# Patient Record
Sex: Female | Born: 1940 | Race: White | Hispanic: No | Smoking: Never smoker
Health system: Southern US, Community
[De-identification: ages and names within clinical notes are randomized; demographics above are authoritative.]

## PROBLEM LIST (undated history)

## (undated) DIAGNOSIS — F0280 Dementia in other diseases classified elsewhere without behavioral disturbance: Secondary | ICD-10-CM

## (undated) HISTORY — DX: Dementia in other diseases classified elsewhere, unspecified severity, without behavioral disturbance, psychotic disturbance, mood disturbance, and anxiety: F02.80

---

## 2019-12-07 ENCOUNTER — Other Ambulatory Visit: Payer: Self-pay | Admitting: Acute Care

## 2019-12-07 DIAGNOSIS — R413 Other amnesia: Secondary | ICD-10-CM

## 2019-12-16 ENCOUNTER — Ambulatory Visit
Admission: RE | Admit: 2019-12-16 | Discharge: 2019-12-16 | Disposition: A | Discharge status: Home / Self Care | Payer: Medicare Other | Source: Ambulatory Visit | Attending: Acute Care | Admitting: Acute Care

## 2019-12-16 ENCOUNTER — Other Ambulatory Visit: Payer: Self-pay

## 2019-12-16 DIAGNOSIS — R413 Other amnesia: Secondary | ICD-10-CM | POA: Insufficient documentation

## 2020-08-20 ENCOUNTER — Other Ambulatory Visit: Payer: Self-pay | Admitting: Internal Medicine

## 2020-08-20 DIAGNOSIS — Z1231 Encounter for screening mammogram for malignant neoplasm of breast: Secondary | ICD-10-CM

## 2020-09-20 ENCOUNTER — Ambulatory Visit
Admission: RE | Admit: 2020-09-20 | Discharge: 2020-09-20 | Disposition: A | Discharge status: Home / Self Care | Payer: Medicare Other | Source: Ambulatory Visit | Attending: Internal Medicine | Admitting: Internal Medicine

## 2020-09-20 ENCOUNTER — Other Ambulatory Visit: Payer: Self-pay

## 2020-09-20 DIAGNOSIS — Z1231 Encounter for screening mammogram for malignant neoplasm of breast: Secondary | ICD-10-CM | POA: Insufficient documentation

## 2021-03-15 ENCOUNTER — Ambulatory Visit: Payer: Medicare Other | Attending: Internal Medicine

## 2021-03-15 ENCOUNTER — Other Ambulatory Visit: Payer: Self-pay

## 2021-03-15 DIAGNOSIS — Z23 Encounter for immunization: Secondary | ICD-10-CM

## 2021-03-15 MED ORDER — PFIZER-BIONT COVID-19 VAC-TRIS 30 MCG/0.3ML IM SUSP
INTRAMUSCULAR | 0 refills | Status: DC
  Filled 2021-03-15: qty 0.3, 1d supply, fill #0

## 2021-03-15 NOTE — Progress Notes (Signed)
   Covid-19 Vaccination Clinic  Name:  [name]    MRN: [id number] DOB: January 12, 1941  03/15/2021  [name] was observed post Covid-19 immunization for 15 minutes without incident. She was provided with Vaccine Information Sheet and instruction to access the V-Safe system.   [name] was instructed to call 911 with any severe reactions post vaccine: Marland Kitchen Difficulty breathing  . Swelling of face and throat  . A fast heartbeat  . A bad rash all over body  . Dizziness and weakness   Immunizations Administered    Name Date Dose VIS Date Route   PFIZER Comrnaty(Gray TOP) Covid-19 Vaccine 03/15/2021 10:55 AM 0.3 mL 11/08/2020 Intramuscular   Manufacturer: [employer], [employer]   Lot: [id number]   NDC: [phone]

## 2021-08-15 ENCOUNTER — Other Ambulatory Visit: Payer: Self-pay | Admitting: Internal Medicine

## 2021-08-15 DIAGNOSIS — Z1231 Encounter for screening mammogram for malignant neoplasm of breast: Secondary | ICD-10-CM

## 2021-09-24 ENCOUNTER — Ambulatory Visit
Admission: RE | Admit: 2021-09-24 | Discharge: 2021-09-24 | Disposition: A | Discharge status: Home / Self Care | Payer: Medicare Other | Source: Ambulatory Visit | Attending: Internal Medicine | Admitting: Internal Medicine

## 2021-09-24 ENCOUNTER — Other Ambulatory Visit: Payer: Self-pay

## 2021-09-24 DIAGNOSIS — Z1231 Encounter for screening mammogram for malignant neoplasm of breast: Secondary | ICD-10-CM | POA: Diagnosis present

## 2022-03-10 IMAGING — MG MM DIGITAL SCREENING BILAT W/ TOMO AND CAD
6 of 12 series · 6 of 36 positions shown · non-contrast
Comparison: Previous exam(s).

CLINICAL DATA: Screening.

EXAM:
DIGITAL SCREENING BILATERAL MAMMOGRAM WITH TOMOSYNTHESIS AND CAD
TECHNIQUE: Bilateral screening digital craniocaudal and mediolateral oblique
mammograms were obtained. Bilateral screening digital breast
tomosynthesis was performed. The images were evaluated with
computer-aided detection.

[L MLO synth-2D]
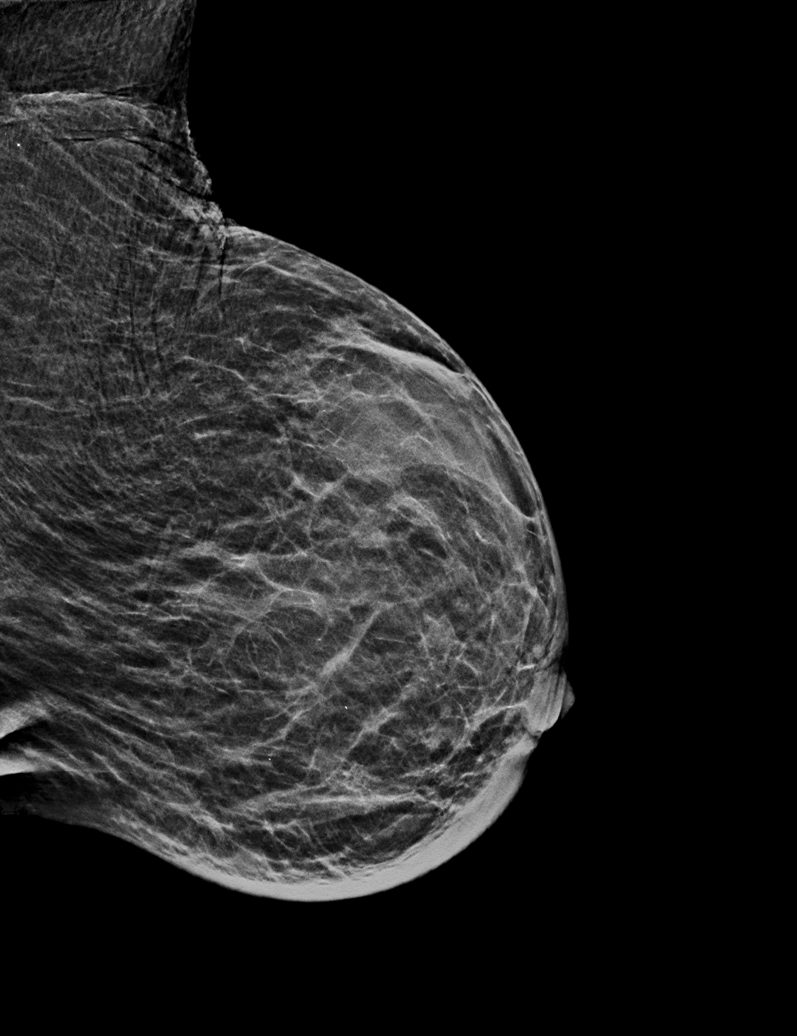

[R MLO synth-2D (1 of 3)]
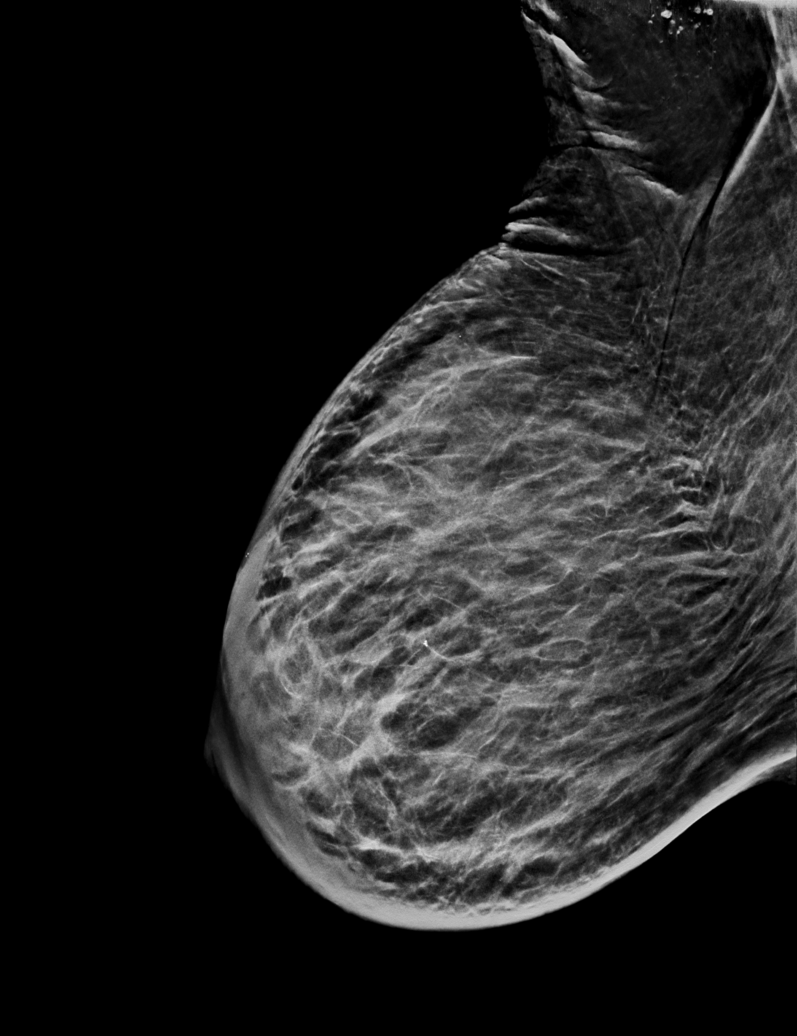

[R MLO synth-2D (2 of 3)]
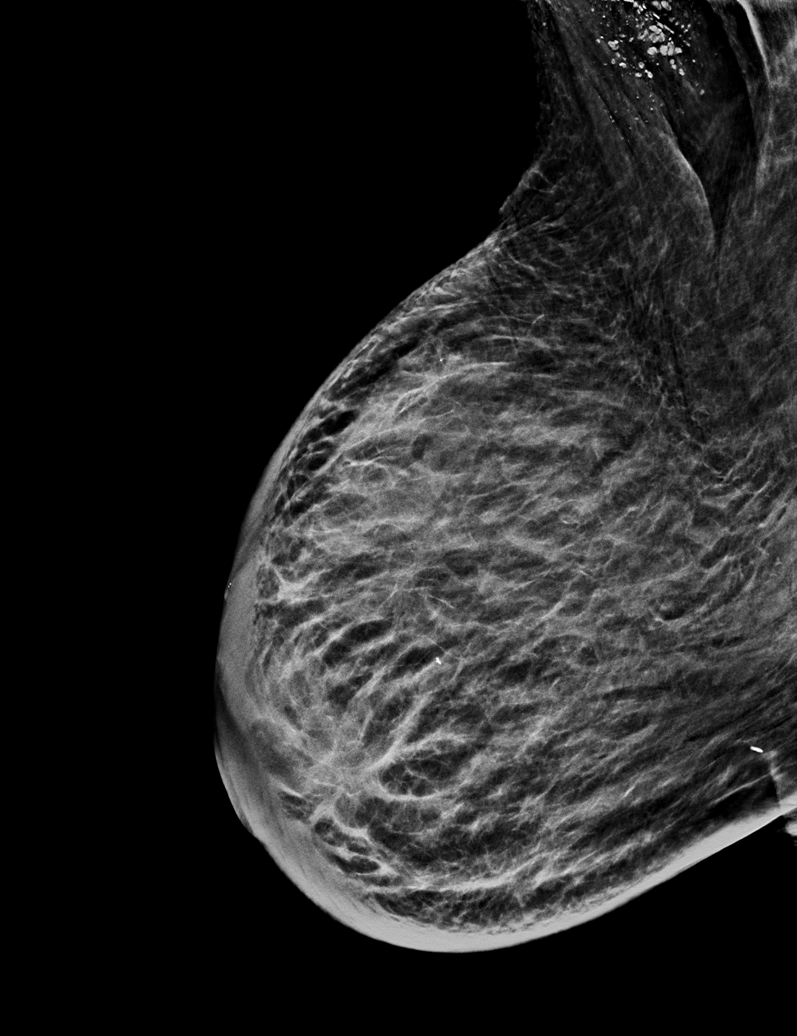

[R MLO synth-2D (3 of 3)]
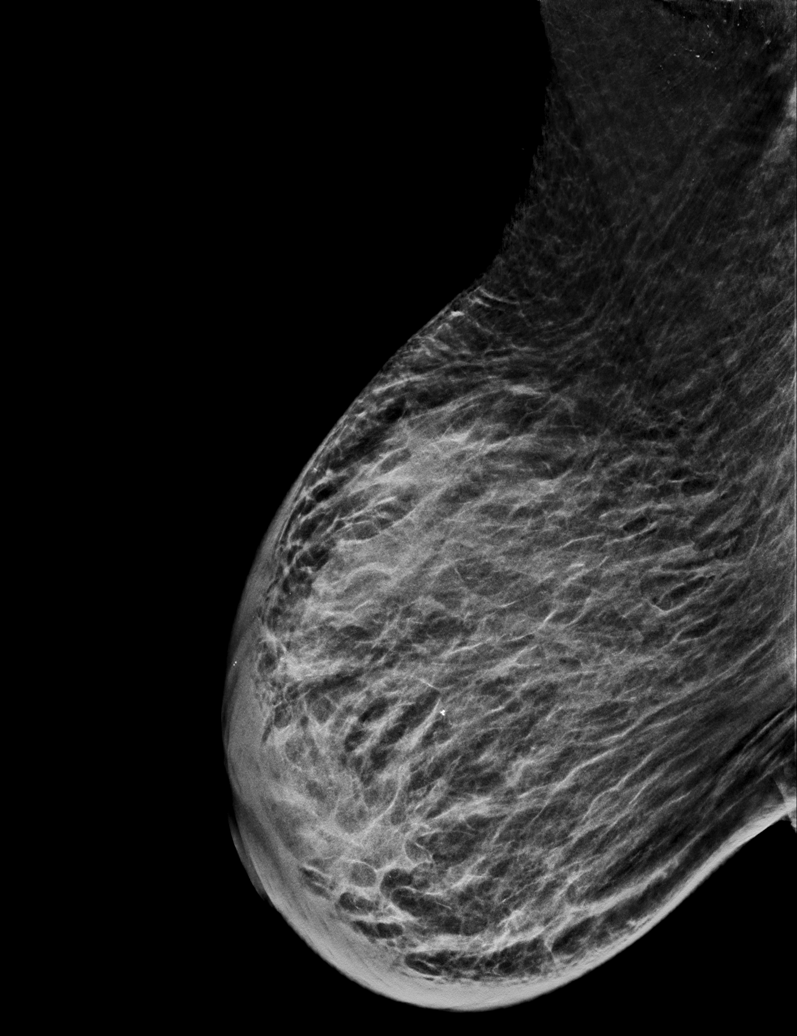

[R CC synth-2D]
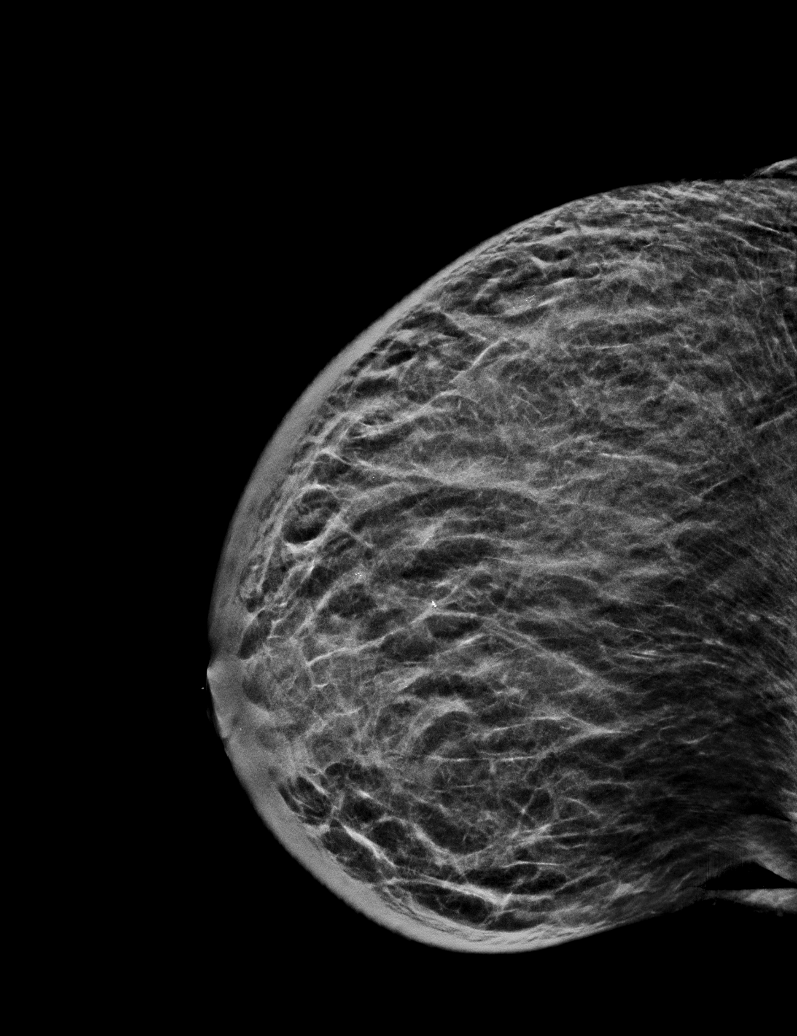

[L CC synth-2D]
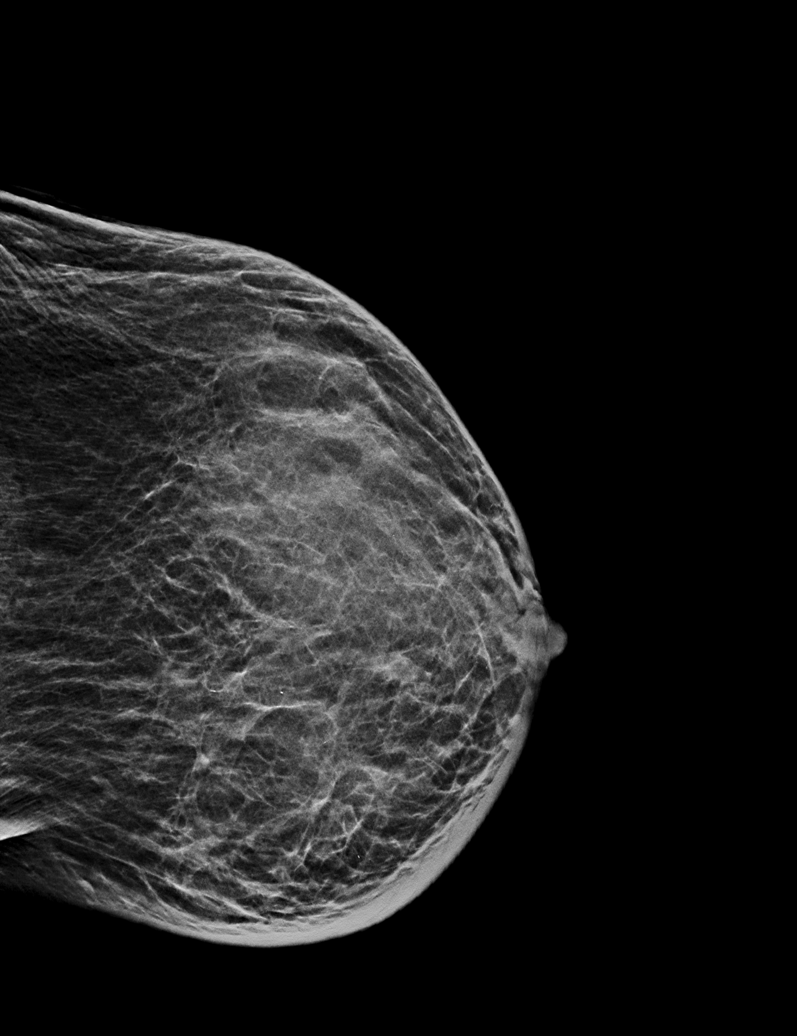

[6 of 36 positions shown; findings below may reference images not displayed]

ACR Breast Density Category c: The breast tissue is heterogeneously
dense, which may obscure small masses.
FINDINGS: There are no findings suspicious for malignancy.

Benign bilateral skin thickening and trabecular thickening
consistent with edema from systemic fluid retention.
IMPRESSION: No mammographic evidence of malignancy. A result letter of this
screening mammogram will be mailed directly to the patient.

RECOMMENDATION:
Screening mammogram in one year. (Code:ZT-[id number])

BI-RADS CATEGORY  2: Benign.  Left o'clock 1 or

## 2022-06-01 ENCOUNTER — Emergency Department: Payer: Medicare Other

## 2022-06-01 ENCOUNTER — Encounter: Payer: Self-pay | Admitting: Radiology

## 2022-06-01 ENCOUNTER — Inpatient Hospital Stay
Admission: EM | Admit: 2022-06-01 | Discharge: 2022-06-04 | DRG: 871 | Disposition: A | Discharge status: Home Health | Payer: Medicare Other | Source: Skilled Nursing Facility | Attending: Internal Medicine | Admitting: Internal Medicine

## 2022-06-01 ENCOUNTER — Other Ambulatory Visit: Payer: Self-pay

## 2022-06-01 DIAGNOSIS — N179 Acute kidney failure, unspecified: Secondary | ICD-10-CM | POA: Diagnosis present

## 2022-06-01 DIAGNOSIS — F02818 Dementia in other diseases classified elsewhere, unspecified severity, with other behavioral disturbance: Secondary | ICD-10-CM | POA: Diagnosis present

## 2022-06-01 DIAGNOSIS — D649 Anemia, unspecified: Secondary | ICD-10-CM | POA: Diagnosis present

## 2022-06-01 DIAGNOSIS — I959 Hypotension, unspecified: Secondary | ICD-10-CM | POA: Diagnosis not present

## 2022-06-01 DIAGNOSIS — M109 Gout, unspecified: Secondary | ICD-10-CM | POA: Diagnosis present

## 2022-06-01 DIAGNOSIS — G9341 Metabolic encephalopathy: Secondary | ICD-10-CM | POA: Diagnosis present

## 2022-06-01 DIAGNOSIS — Z8544 Personal history of malignant neoplasm of other female genital organs: Secondary | ICD-10-CM | POA: Diagnosis not present

## 2022-06-01 DIAGNOSIS — Z20822 Contact with and (suspected) exposure to covid-19: Secondary | ICD-10-CM | POA: Diagnosis present

## 2022-06-01 DIAGNOSIS — Z803 Family history of malignant neoplasm of breast: Secondary | ICD-10-CM | POA: Diagnosis not present

## 2022-06-01 DIAGNOSIS — I10 Essential (primary) hypertension: Secondary | ICD-10-CM | POA: Diagnosis present

## 2022-06-01 DIAGNOSIS — N390 Urinary tract infection, site not specified: Secondary | ICD-10-CM | POA: Diagnosis present

## 2022-06-01 DIAGNOSIS — J189 Pneumonia, unspecified organism: Secondary | ICD-10-CM | POA: Diagnosis not present

## 2022-06-01 DIAGNOSIS — J13 Pneumonia due to Streptococcus pneumoniae: Secondary | ICD-10-CM | POA: Diagnosis present

## 2022-06-01 DIAGNOSIS — F03918 Unspecified dementia, unspecified severity, with other behavioral disturbance: Secondary | ICD-10-CM

## 2022-06-01 DIAGNOSIS — R7303 Prediabetes: Secondary | ICD-10-CM | POA: Diagnosis present

## 2022-06-01 DIAGNOSIS — A403 Sepsis due to Streptococcus pneumoniae: Principal | ICD-10-CM | POA: Diagnosis present

## 2022-06-01 DIAGNOSIS — R6520 Severe sepsis without septic shock: Secondary | ICD-10-CM | POA: Diagnosis present

## 2022-06-01 DIAGNOSIS — A419 Sepsis, unspecified organism: Secondary | ICD-10-CM

## 2022-06-01 DIAGNOSIS — G309 Alzheimer's disease, unspecified: Secondary | ICD-10-CM | POA: Diagnosis present

## 2022-06-01 LAB — RESP PANEL BY RT-PCR (FLU A&B, COVID) ARPGX2
Influenza A by PCR: NEGATIVE
Influenza B by PCR: NEGATIVE
SARS Coronavirus 2 by RT PCR: NEGATIVE

## 2022-06-01 LAB — COMPREHENSIVE METABOLIC PANEL
ALT: 14 U/L (ref 0–44)
AST: 25 U/L (ref 15–41)
Albumin: 2.8 g/dL — ABNORMAL LOW (ref 3.5–5.0)
Alkaline Phosphatase: 56 U/L (ref 38–126)
Anion gap: 8 (ref 5–15)
BUN: 53 mg/dL — ABNORMAL HIGH (ref 8–23)
CO2: 26 mmol/L (ref 22–32)
Calcium: 9.6 mg/dL (ref 8.9–10.3)
Chloride: 109 mmol/L (ref 98–111)
Creatinine, Ser: 1.52 mg/dL — ABNORMAL HIGH (ref 0.44–1.00)
GFR, Estimated: 35 mL/min — ABNORMAL LOW (ref >60)
Glucose, Bld: 124 mg/dL — ABNORMAL HIGH (ref 70–99)
Potassium: 3.9 mmol/L (ref 3.5–5.1)
Sodium: 143 mmol/L (ref 135–145)
Total Bilirubin: 0.7 mg/dL (ref 0.3–1.2)
Total Protein: 5.7 g/dL — ABNORMAL LOW (ref 6.5–8.1)

## 2022-06-01 LAB — LACTIC ACID, PLASMA
Lactic Acid, Venous: 1.7 mmol/L (ref 0.5–1.9)
Lactic Acid, Venous: 1 mmol/L (ref 0.5–1.9)

## 2022-06-01 LAB — URINALYSIS, COMPLETE (UACMP) WITH MICROSCOPIC
Bilirubin Urine: NEGATIVE
Glucose, UA: NEGATIVE mg/dL
Hgb urine dipstick: NEGATIVE
Ketones, ur: NEGATIVE mg/dL
Nitrite: NEGATIVE
Protein, ur: NEGATIVE mg/dL
Specific Gravity, Urine: 1.02 (ref 1.005–1.030)
pH: 5 (ref 5.0–8.0)

## 2022-06-01 LAB — CBC WITH DIFFERENTIAL/PLATELET
Abs Immature Granulocytes: 0.11 10*3/uL — ABNORMAL HIGH (ref 0.00–0.07)
Basophils Absolute: 0.1 10*3/uL (ref 0.0–0.1)
Basophils Relative: 0 %
Eosinophils Absolute: 0.1 10*3/uL (ref 0.0–0.5)
Eosinophils Relative: 1 %
HCT: 33.9 % — ABNORMAL LOW (ref 36.0–46.0)
Hemoglobin: 10.9 g/dL — ABNORMAL LOW (ref 12.0–15.0)
Immature Granulocytes: 1 %
Lymphocytes Relative: 7 %
Lymphs Abs: 1.3 10*3/uL (ref 0.7–4.0)
MCH: 30.4 pg (ref 26.0–34.0)
MCHC: 32.2 g/dL (ref 30.0–36.0)
MCV: 94.4 fL (ref 80.0–100.0)
Monocytes Absolute: 0.7 10*3/uL (ref 0.1–1.0)
Monocytes Relative: 4 %
Neutro Abs: 16 10*3/uL — ABNORMAL HIGH (ref 1.7–7.7)
Neutrophils Relative %: 87 %
Platelets: 176 10*3/uL (ref 150–400)
RBC: 3.59 MIL/uL — ABNORMAL LOW (ref 3.87–5.11)
RDW: 12.9 % (ref 11.5–15.5)
WBC: 18.2 10*3/uL — ABNORMAL HIGH (ref 4.0–10.5)
nRBC: 0 % (ref 0.0–0.2)

## 2022-06-01 LAB — TROPONIN I (HIGH SENSITIVITY)
Troponin I (High Sensitivity): 12 ng/L (ref <18)
Troponin I (High Sensitivity): 9 ng/L (ref <18)

## 2022-06-01 LAB — BRAIN NATRIURETIC PEPTIDE: B Natriuretic Peptide: 165.3 pg/mL — ABNORMAL HIGH (ref 0.0–100.0)

## 2022-06-01 LAB — PROTIME-INR
INR: 1.4 — ABNORMAL HIGH (ref 0.8–1.2)
Prothrombin Time: 16.9 seconds — ABNORMAL HIGH (ref 11.4–15.2)

## 2022-06-01 LAB — APTT: aPTT: 30 seconds (ref 24–36)

## 2022-06-01 MED ORDER — TRAZODONE HCL 50 MG PO TABS
25.0000 mg | ORAL_TABLET | Freq: Every evening | ORAL | Status: DC | PRN
  Administered 2022-06-02: 25 mg via ORAL
  Filled 2022-06-01: qty 1

## 2022-06-01 MED ORDER — ACETAMINOPHEN 650 MG RE SUPP: 650.0000 mg | Freq: Four times a day (QID) | RECTAL | Status: DC | PRN

## 2022-06-01 MED ORDER — SODIUM CHLORIDE 0.9 % IV SOLN
2.0000 g | INTRAVENOUS | Status: DC
  Administered 2022-06-02 – 2022-06-04 (×3): 2 g via INTRAVENOUS
  Filled 2022-06-01 (×3): qty 2

## 2022-06-01 MED ORDER — SODIUM CHLORIDE 0.9 % IV SOLN: 500.0000 mg | INTRAVENOUS | Status: DC

## 2022-06-01 MED ORDER — GUAIFENESIN ER 600 MG PO TB12
600.0000 mg | ORAL_TABLET | Freq: Two times a day (BID) | ORAL | Status: DC
  Administered 2022-06-02 – 2022-06-04 (×5): 600 mg via ORAL
  Filled 2022-06-01 (×5): qty 1

## 2022-06-01 MED ORDER — QUETIAPINE FUMARATE 25 MG PO TABS
25.0000 mg | ORAL_TABLET | Freq: Every day | ORAL | Status: DC
  Administered 2022-06-02 – 2022-06-03 (×2): 25 mg via ORAL
  Filled 2022-06-01 (×2): qty 1

## 2022-06-01 MED ORDER — ALLOPURINOL 100 MG PO TABS
100.0000 mg | ORAL_TABLET | Freq: Every day | ORAL | Status: DC
  Administered 2022-06-02 – 2022-06-04 (×3): 100 mg via ORAL
  Filled 2022-06-01 (×3): qty 1

## 2022-06-01 MED ORDER — ACETAMINOPHEN 325 MG PO TABS: 650.0000 mg | ORAL_TABLET | Freq: Four times a day (QID) | ORAL | Status: DC | PRN

## 2022-06-01 MED ORDER — METRONIDAZOLE 500 MG/100ML IV SOLN
500.0000 mg | Freq: Once | INTRAVENOUS | Status: AC
  Administered 2022-06-01: 500 mg via INTRAVENOUS
  Filled 2022-06-01: qty 100

## 2022-06-01 MED ORDER — VANCOMYCIN HCL 1500 MG/300ML IV SOLN
1500.0000 mg | Freq: Once | INTRAVENOUS | Status: AC
  Administered 2022-06-01: 1500 mg via INTRAVENOUS
  Filled 2022-06-01: qty 300

## 2022-06-01 MED ORDER — ONDANSETRON HCL 4 MG/2ML IJ SOLN: 4.0000 mg | Freq: Four times a day (QID) | INTRAMUSCULAR | Status: DC | PRN

## 2022-06-01 MED ORDER — LORAZEPAM 0.5 MG PO TABS
0.5000 mg | ORAL_TABLET | Freq: Two times a day (BID) | ORAL | Status: DC | PRN
  Administered 2022-06-02: 0.5 mg via ORAL
  Filled 2022-06-01 (×3): qty 1

## 2022-06-01 MED ORDER — MEMANTINE HCL 5 MG PO TABS
5.0000 mg | ORAL_TABLET | Freq: Two times a day (BID) | ORAL | Status: DC
  Administered 2022-06-02 – 2022-06-04 (×5): 5 mg via ORAL
  Filled 2022-06-01 (×5): qty 1

## 2022-06-01 MED ORDER — MAGNESIUM HYDROXIDE 400 MG/5ML PO SUSP: 30.0000 mL | Freq: Every day | ORAL | Status: DC | PRN

## 2022-06-01 MED ORDER — MAGNESIUM HYDROXIDE 400 MG/5ML PO SUSP
30.0000 mL | Freq: Every day | ORAL | Status: DC | PRN
Start: 2022-06-01 — End: 2022-06-04

## 2022-06-01 MED ORDER — ENOXAPARIN SODIUM 40 MG/0.4ML IJ SOSY: 40.0000 mg | PREFILLED_SYRINGE | INTRAMUSCULAR | Status: DC

## 2022-06-01 MED ORDER — SODIUM CHLORIDE 0.9 % IV SOLN: INTRAVENOUS | Status: DC

## 2022-06-01 MED ORDER — ENOXAPARIN SODIUM 40 MG/0.4ML IJ SOSY
40.0000 mg | PREFILLED_SYRINGE | INTRAMUSCULAR | Status: DC
  Administered 2022-06-02 – 2022-06-04 (×3): 40 mg via SUBCUTANEOUS
  Filled 2022-06-01 (×3): qty 0.4

## 2022-06-01 MED ORDER — ONDANSETRON HCL 4 MG PO TABS: 4.0000 mg | ORAL_TABLET | Freq: Four times a day (QID) | ORAL | Status: DC | PRN

## 2022-06-01 MED ORDER — HYDROCOD POLI-CHLORPHE POLI ER 10-8 MG/5ML PO SUER: 5.0000 mL | Freq: Two times a day (BID) | ORAL | Status: DC | PRN

## 2022-06-01 MED ORDER — SODIUM CHLORIDE 0.9 % IV SOLN
500.0000 mg | INTRAVENOUS | Status: DC
  Administered 2022-06-01 – 2022-06-03 (×3): 500 mg via INTRAVENOUS
  Filled 2022-06-01 (×3): qty 5

## 2022-06-01 MED ORDER — VANCOMYCIN HCL IN DEXTROSE 1-5 GM/200ML-% IV SOLN
1000.0000 mg | Freq: Once | INTRAVENOUS | Status: DC
  Filled 2022-06-01: qty 200

## 2022-06-01 MED ORDER — IPRATROPIUM-ALBUTEROL 0.5-2.5 (3) MG/3ML IN SOLN: 3.0000 mL | Freq: Four times a day (QID) | RESPIRATORY_TRACT | Status: DC

## 2022-06-01 MED ORDER — LOSARTAN POTASSIUM 50 MG PO TABS
50.0000 mg | ORAL_TABLET | Freq: Two times a day (BID) | ORAL | Status: DC
  Administered 2022-06-03 – 2022-06-04 (×3): 50 mg via ORAL
  Filled 2022-06-01 (×4): qty 1

## 2022-06-01 MED ORDER — SODIUM CHLORIDE 0.9 % IV BOLUS (SEPSIS)
1000.0000 mL | Freq: Once | INTRAVENOUS | Status: AC
  Administered 2022-06-01: 1000 mL via INTRAVENOUS

## 2022-06-01 MED ORDER — TRAZODONE HCL 50 MG PO TABS: 25.0000 mg | ORAL_TABLET | Freq: Every evening | ORAL | Status: DC | PRN

## 2022-06-01 MED ORDER — SODIUM CHLORIDE 0.9 % IV SOLN: 2.0000 g | INTRAVENOUS | Status: DC

## 2022-06-01 MED ORDER — LACTATED RINGERS IV SOLN: INTRAVENOUS | Status: DC

## 2022-06-01 MED ORDER — ACETAMINOPHEN 325 MG RE SUPP: 650.0000 mg | Freq: Four times a day (QID) | RECTAL | Status: DC | PRN

## 2022-06-01 MED ORDER — IPRATROPIUM-ALBUTEROL 0.5-2.5 (3) MG/3ML IN SOLN
3.0000 mL | Freq: Four times a day (QID) | RESPIRATORY_TRACT | Status: DC
  Administered 2022-06-02: 3 mL via RESPIRATORY_TRACT
  Filled 2022-06-01: qty 3

## 2022-06-01 MED ORDER — SODIUM CHLORIDE 0.9 % IV SOLN
2.0000 g | Freq: Once | INTRAVENOUS | Status: AC
  Administered 2022-06-01: 2 g via INTRAVENOUS
  Filled 2022-06-01: qty 12.5

## 2022-06-01 MED ORDER — ZINC SULFATE 220 (50 ZN) MG PO CAPS
220.0000 mg | ORAL_CAPSULE | Freq: Every day | ORAL | Status: DC
  Administered 2022-06-02 – 2022-06-04 (×3): 220 mg via ORAL
  Filled 2022-06-01 (×3): qty 1

## 2022-06-01 MED ORDER — GUAIFENESIN ER 600 MG PO TB12: 600.0000 mg | ORAL_TABLET | Freq: Two times a day (BID) | ORAL | Status: DC

## 2022-06-01 NOTE — ED Notes (Signed)
Pt sister stating pt had fallen today per report from SNF. Pt sister stating he had fallen twice this week. Sister unsure if snf was concerned for injury.

## 2022-06-01 NOTE — ED Triage Notes (Signed)
Pt from [facility], reportedly more altered than normal. Hx of dementia, but EMS provided no other details on baseline mental status.

## 2022-06-01 NOTE — Consult Note (Signed)
CODE SEPSIS - PHARMACY COMMUNICATION  **Broad Spectrum Antibiotics should be administered within 1 hour of Sepsis diagnosis**  Time Code Sepsis Called/Page Received: 19:51  Antibiotics Ordered: cefepime, metronidazole & Vancomycin  Blood cx collected: 20:07  Time of 1st antibiotic administration: 20:12  Additional action taken by pharmacy: none  If necessary, Name of Provider/Nurse Contacted: NA   Geordie Nooney Rodriguez-Guzman PharmD, BCPS 06/01/2022 7:56 PM

## 2022-06-01 NOTE — Progress Notes (Signed)
Code sepsis tracking by eLINK 

## 2022-06-01 NOTE — Consult Note (Signed)
PHARMACY -  BRIEF ANTIBIOTIC NOTE   Pharmacy has received consult(s) for Cefepime and Vancomycin from an ED provider.  The patient's profile has been reviewed for ht/wt/allergies/indication/available labs.    One time order(s) placed for : Cefepime 2gm Vancomycin 1500mg   Further antibiotics/pharmacy consults should be ordered by admitting physician if indicated.                       Thank you, Apolonia Ellwood Rodriguez-Guzman PharmD, BCPS 06/01/2022 7:59 PM

## 2022-06-01 NOTE — ED Triage Notes (Signed)
Pt arrives today from [facility] for AMS. Pt has PMH of Dementia but staff today is concerned for increased altered mentation. Pt was normotensive this morning but was found hypotensive this evening and EMS was called. EMS found 76/54 NSR. 300 LR was given in L 20 AC IV. Pt arrives alert but disoriented.

## 2022-06-01 NOTE — ED Notes (Signed)
Pt fidgeting in bed. Pt stating "I am going..." pt placed on posey bed exit alarm.

## 2022-06-01 NOTE — H&P (Addendum)
[place]   PATIENT NAME: Monique Johnson    MR#:  [id number]  DATE OF BIRTH:  07-23-1941  DATE OF ADMISSION:  06/01/2022  PRIMARY CARE PHYSICIAN: Lauro Regulus, MD   Patient is coming from: [facility] SNF  REQUESTING/REFERRING PHYSICIAN: Minna Antis, MD  CHIEF COMPLAINT:   Chief Complaint  Patient presents with   Altered Mental Status    HISTORY OF PRESENT ILLNESS:  Monique Johnson is a 81 y.o. female with medical history significant for essential hypertension, Alzheimer's dementia, gout and prediabetes, presented to the emergency room with a [place] of altered mental status with increased somnolence and difficulty to be aroused.  The patient has baseline advanced dementia but has been more sleepy per her daughter.  She was found hypotensive after SNF with a BP of 76/54.  She was started on IV lactated Ringer and brought to the emergency room.  She was very somnolent and unable to give any history in the ER.  No reported dysuria, oliguria or hematuria, urgency or frequency or flank pain.  ED Course: Upon presentation to the emergency room BP was 90/52 with otherwise normal vital signs.  BP later on was 108/61 .   Labs revealed lactic acid 1.7 and later 1 and high-sensitivity troponin I.  CBC showed significant leukocytosis of 18.2 with neutrophilia and anemia.  CMP revealed a BUN of 53 and creatinine 1.52 with albumin of 2.8 and protein 5.7.  BNP was 165.3. Influenza antigens and COVID-19 PCR came back negative. EKG as reviewed by me : EKG showed likely sinus tachycardia with a rate of 152 with suboptimal tracing.  Repeat EKG showed sinus rhythm with a rate of 72 with poor R wave progression. Imaging: Portable chest ray showed the following: 1. Multifocal airspace consolidation throughout both lungs, greatest involving the right upper lobe, suspicious for multifocal pneumonia. Recommend radiographic follow-up to ensure resolution after course of treatment to  exclude the possibility of underlying neoplastic process. 2. Cardiomegaly with tortuous thoracic aorta.  The patient was given IV cefepime and Flagyl as well as vancomycin and 1 L bolus of IV normal saline.  She will be admitted to a medical telemetry bed for further evaluation and management.  PAST MEDICAL HISTORY:  -Essential hypertension - Alzheimer's dementia - Gout - History of vulvar cancer - Prediabetes  PAST SURGICAL HISTORY:  History reviewed. No pertinent surgical history.  No previous reported surgeries.  SOCIAL HISTORY:   Social History   Tobacco Use   Smoking status: Unknown   Smokeless tobacco: Not on file  Substance Use Topics   Alcohol use: Not on file  No reported history of tobacco or HH abuse or illicit drug use.  FAMILY HISTORY:   Family History  Problem Relation Age of Onset   Breast cancer Mother     DRUG ALLERGIES:   Allergies  Allergen Reactions   Levofloxacin Other (See Comments)    RED AND HOT    REVIEW OF SYSTEMS:   ROS As per history of present illness. All pertinent systems were reviewed above. Constitutional, HEENT, cardiovascular, respiratory, GI, GU, musculoskeletal, neuro, psychiatric, endocrine, integumentary and hematologic systems were reviewed and are otherwise negative/unremarkable except for positive findings mentioned above in the HPI.   MEDICATIONS AT HOME:   Prior to Admission medications   Medication Sig Start Date End Date Taking? Authorizing Provider  COVID-19 mRNA Vac-TriS, Pfizer, (PFIZER-BIONT COVID-19 VAC-TRIS) SUSP injection Inject into the muscle. 03/15/21   Judyann Munson, MD  VITAL SIGNS:  Blood pressure (!) 106/51, pulse 74, temperature 97.8 F (36.6 C), resp. rate 20, height 5\' 3"  (1.6 m), weight 64.2 kg, SpO2 100 %.  PHYSICAL EXAMINATION:  Physical Exam  GENERAL:  81 y.o.-year-old fairly somnolent elderly Caucasian female patient lying in the bed with no acute distress.  She is slightly  difficult to arouse. EYES: Pupils equal, round, reactive to light and accommodation. No scleral icterus. Extraocular muscles intact.  HEENT: Head atraumatic, normocephalic. Oropharynx and nasopharynx clear.  NECK:  Supple, no jugular venous distention. No thyroid enlargement, no tenderness.  LUNGS: Diminished bibasilar and midlung zone breath sounds with scattered crackles.  No use of accessory muscles of respiration.  CARDIOVASCULAR: Regular rate and rhythm, S1, S2 normal. No murmurs, rubs, or gallops.  ABDOMEN: Soft, nondistended, nontender. Bowel sounds present. No organomegaly or mass.  EXTREMITIES: No pedal edema, cyanosis, or clubbing.  NEUROLOGIC: Grossly nonfocal.. Sensation intact. Gait not checked.  PSYCHIATRIC: She was very somnolent and difficult to arouse.  SKIN: No obvious rash, lesion, or ulcer.   LABORATORY PANEL:   CBC Recent Labs  Lab 06/01/22 1915  WBC 18.2*  HGB 10.9*  HCT 33.9*  PLT 176   ------------------------------------------------------------------------------------------------------------------  Chemistries  Recent Labs  Lab 06/01/22 1915  NA 143  K 3.9  CL 109  CO2 26  GLUCOSE 124*  BUN 53*  CREATININE 1.52*  CALCIUM 9.6  AST 25  ALT 14  ALKPHOS 56  BILITOT 0.7   ------------------------------------------------------------------------------------------------------------------  Cardiac Enzymes No results for input(s): "TROPONINI" in the last 168 hours. ------------------------------------------------------------------------------------------------------------------  RADIOLOGY:  CT HEAD WO CONTRAST (08/02/22)  Result Date: 06/01/2022 CLINICAL DATA:  Head trauma, minor (Age >= 65y) Dementia patient, more alter than usual. EXAM: CT HEAD WITHOUT CONTRAST TECHNIQUE: Contiguous axial images were obtained from the base of the skull through the vertex without intravenous contrast. RADIATION DOSE REDUCTION: This exam was performed according to the  departmental dose-optimization program which includes automated exposure control, adjustment of the mA and/or kV according to patient size and/or use of iterative reconstruction technique. COMPARISON:  Brain MRI 12/16/2019 FINDINGS: Brain: No intracranial hemorrhage, mass effect, or midline shift. Normal for age brain volume. No hydrocephalus. The basilar cisterns are patent. Mild periventricular chronic small vessel ischemia. No evidence of territorial infarct or acute ischemia. No extra-axial or intracranial fluid collection. Vascular: No hyperdense vessel or unexpected calcification. Skull: No fracture or focal lesion.  Calvarial hyperostosis. Sinuses/Orbits: Paranasal sinuses and mastoid air cells are clear. The visualized orbits are unremarkable. Bilateral cataract resection. Other: None. IMPRESSION: 1. No acute intracranial abnormality. No skull fracture. 2. Mild chronic small vessel ischemia. Electronically Signed   By: 12/18/2019 M.D.   On: 06/01/2022 21:36   DG Chest Port 1 View  Result Date: 06/01/2022 CLINICAL DATA:  Questionable sepsis - evaluate for abnormality Altered mental status. EXAM: PORTABLE CHEST 1 VIEW COMPARISON:  None Available. FINDINGS: Lung volumes are low. Mild cardiomegaly. Tortuous thoracic aorta. Multifocal airspace consolidation, most prominent involving the right upper lobe, lesser within both lung bases. No pulmonary edema. Previous small right pleural effusion. No pneumothorax. IMPRESSION: 1. Multifocal airspace consolidation throughout both lungs, greatest involving the right upper lobe, suspicious for multifocal pneumonia. Recommend radiographic follow-up to ensure resolution after course of treatment to exclude the possibility of underlying neoplastic process. 2. Cardiomegaly with tortuous thoracic aorta. Electronically Signed   By: 08/02/2022 M.D.   On: 06/01/2022 20:33      IMPRESSION AND PLAN:  Assessment and Plan: *  Sepsis due to pneumonia [facility]) - This  is associated with multifocal pneumonia. - Sepsis manifested by significant Oxydose and mild tachypnea. - The patient will be admitted to a medical telemetry bed. - We will continue antibiotic therapy with IV Rocephin and Zithromax. - Mucolytic therapy will be provided. - Bronchodilator therapy will be given. - O2 protocol will be followed.  AKI (acute kidney injury) (HCC) She will better with IV normal saline and will follow BMP. - We will avoid nephrotoxins.  Gout - We will continue allopurinol.  Dementia with behavioral disturbance (HCC) - We will continue Aricept and Seroquel.  Essential hypertension - The patient will be placed on as needed IV hydralazine. - We will hold off Cozaar given acute kidney injury.   DVT prophylaxis: Lovenox.  Advanced Care Planning:  Code Status: full code. Family Communication:  The plan of care was discussed in details with the patient (and family). I answered all questions. The patient agreed to proceed with the above mentioned plan. Further management will depend upon hospital course. Disposition Plan: Back to previous home environment Consults called: None.  All the records are reviewed and case discussed with ED provider.  Status is: Inpatient   At the time of the admission, it appears that the appropriate admission status for this patient is inpatient.  This is judged to be reasonable and necessary in order to provide the required intensity of service to ensure the patient's safety given the presenting symptoms, physical exam findings and initial radiographic and laboratory data in the context of comorbid conditions.  The patient requires inpatient status due to high intensity of service, high risk of further deterioration and high frequency of surveillance required.  I certify that at the time of admission, it is my clinical judgment that the patient will require inpatient hospital care extending more than 2 midnights.                             Dispo: The patient is from: [facility]              Anticipated d/c is to: [facility]              Patient currently is not medically stable to d/c.              Difficult to place patient: No  Hannah Beat M.D on 06/02/2022 at 1:13 AM  [facility]   From 7 PM-7 AM, contact night-coverage [email or web address]  CC: Primary care physician; Lauro Regulus, MD

## 2022-06-01 NOTE — ED Provider Notes (Signed)
[facility] Provider Note    Event Date/Time   First MD Initiated Contact with Patient 06/01/22 1942     (approximate)  History   Chief Complaint: Altered Mental Status  HPI  Monique Johnson is a 81 y.o. female with a past medical history of dementia presents from [facility] for altered mental status.  According to EMS report patient has baseline dementia but today was noted to be increasingly altered and somnolent.  EMS found the patient to be hypotensive 76/54 started the patient on lactated Ringer's and brought the patient to the emergency department.  Here the patient is somnolent but awakens easily to voice.  No acute distress.  Patient is unable to give any history of why she is here or any current complaints.  Physical Exam   Triage Vital Signs: ED Triage Vitals  Enc Vitals Group     BP 06/01/22 1902 (!) 90/52     Pulse Rate 06/01/22 1902 75     Resp 06/01/22 1902 18     Temp 06/01/22 1902 98 F (36.7 C)     Temp Source 06/01/22 1902 Oral     SpO2 06/01/22 1902 94 %     Weight 06/01/22 1903 150 lb (68 kg)     Height 06/01/22 1903 5\' 7"  (1.702 m)     Head Circumference --      Peak Flow --      Pain Score 06/01/22 1902 0     Pain Loc --      Pain Edu? --      Excl. in GC? --     Most recent vital signs: Vitals:   06/01/22 1907 06/01/22 1930  BP:  (!) 97/50  Pulse:  70  Resp:  (!) 22  Temp: 98.4 F (36.9 C)   SpO2:  94%    General: Awake, no distress.  CV:  Good peripheral perfusion.  Regular rate and rhythm  Resp:  Normal effort.  Equal breath sounds bilaterally.  Abd:  No distention.  Soft, nontender.  No rebound or guarding.   ED Results / Procedures / Treatments   EKG  EKG viewed and interpreted by myself shows a normal sinus rhythm at 72 bpm with a narrow QRS, normal axis, normal intervals, no concerning ST changes.  RADIOLOGY  I have viewed and interpreted the CT head images.  No significant bleed seen on my  evaluation. CT read as negative by radiology. Chest x-ray read as most likely multifocal pneumonia   MEDICATIONS ORDERED IN ED: Medications  lactated ringers infusion (has no administration in time range)  sodium chloride 0.9 % bolus 1,000 mL (has no administration in time range)  ceFEPIme (MAXIPIME) 2 g in sodium chloride 0.9 % 100 mL IVPB (2 g Intravenous New Bag/Given 06/01/22 2012)  metroNIDAZOLE (FLAGYL) IVPB 500 mg (has no administration in time range)  vancomycin (VANCOREADY) IVPB 1500 mg/300 mL (has no administration in time range)     IMPRESSION / MDM / ASSESSMENT AND PLAN / ED COURSE  I reviewed the triage vital signs and the nursing notes.  Patient's presentation is most consistent with acute presentation with potential threat to life or bodily function.  Patient presents to the emergency department for increased somnolence and altered mental status compared to her baseline dementia.  Here the patient is not able to provide any additional history.  No concerning findings on physical exam.  Patient does appear to have a slight cough at times, is slightly tachypneic  at 22 breaths/min with a pulse ox of 94%.  Patient noted to be hypotensive 97/50.  Started on IV fluids.  Patient's labs have resulted showing significant leukocytosis of 18,000.  Given the patient's leukocytosis tachypnea and hypotension patient meets sepsis criteria we will start the patient on broad-spectrum antibiotics continue with IV hydration while waiting for the labs chest x-ray and urinalysis.  Patient's chest x-ray shows multifocal pneumonia.  Chemistry shows no significant abnormality.  Patient receiving IV fluids, broad-spectrum antibiotics.  I updated the patient's sister.  We will admit to the hospital service for ongoing work-up and treatment.  CRITICAL CARE Performed by: Minna Antis   Total critical care time: 30 minutes  Critical care time was exclusive of separately billable procedures and  treating other patients.  Critical care was necessary to treat or prevent imminent or life-threatening deterioration.  Critical care was time spent personally by me on the following activities: development of treatment plan with patient and/or surrogate as well as nursing, discussions with consultants, evaluation of patient's response to treatment, examination of patient, obtaining history from patient or surrogate, ordering and performing treatments and interventions, ordering and review of laboratory studies, ordering and review of radiographic studies, pulse oximetry and re-evaluation of patient's condition.   FINAL CLINICAL IMPRESSION(S) / ED DIAGNOSES   Sepsis Altered mental status Multifocal pneumonia  Note:  This document was prepared using Dragon voice recognition software and may include unintentional dictation errors.   Minna Antis, MD 06/01/22 2342

## 2022-06-02 DIAGNOSIS — R6520 Severe sepsis without septic shock: Secondary | ICD-10-CM

## 2022-06-02 DIAGNOSIS — I10 Essential (primary) hypertension: Secondary | ICD-10-CM

## 2022-06-02 DIAGNOSIS — J189 Pneumonia, unspecified organism: Principal | ICD-10-CM

## 2022-06-02 DIAGNOSIS — G9341 Metabolic encephalopathy: Secondary | ICD-10-CM

## 2022-06-02 DIAGNOSIS — N179 Acute kidney failure, unspecified: Secondary | ICD-10-CM | POA: Diagnosis not present

## 2022-06-02 DIAGNOSIS — I959 Hypotension, unspecified: Secondary | ICD-10-CM

## 2022-06-02 DIAGNOSIS — A419 Sepsis, unspecified organism: Secondary | ICD-10-CM | POA: Diagnosis not present

## 2022-06-02 DIAGNOSIS — F03918 Unspecified dementia, unspecified severity, with other behavioral disturbance: Secondary | ICD-10-CM

## 2022-06-02 DIAGNOSIS — M109 Gout, unspecified: Secondary | ICD-10-CM

## 2022-06-02 LAB — CBC
HCT: 31.2 % — ABNORMAL LOW (ref 36.0–46.0)
Hemoglobin: 10 g/dL — ABNORMAL LOW (ref 12.0–15.0)
MCH: 30 pg (ref 26.0–34.0)
MCHC: 32.1 g/dL (ref 30.0–36.0)
MCV: 93.7 fL (ref 80.0–100.0)
Platelets: 170 10*3/uL (ref 150–400)
RBC: 3.33 MIL/uL — ABNORMAL LOW (ref 3.87–5.11)
RDW: 13 % (ref 11.5–15.5)
WBC: 13 10*3/uL — ABNORMAL HIGH (ref 4.0–10.5)
nRBC: 0 % (ref 0.0–0.2)

## 2022-06-02 LAB — BASIC METABOLIC PANEL
Anion gap: 4 — ABNORMAL LOW (ref 5–15)
BUN: 48 mg/dL — ABNORMAL HIGH (ref 8–23)
CO2: 24 mmol/L (ref 22–32)
Calcium: 9.2 mg/dL (ref 8.9–10.3)
Chloride: 116 mmol/L — ABNORMAL HIGH (ref 98–111)
Creatinine, Ser: 1.15 mg/dL — ABNORMAL HIGH (ref 0.44–1.00)
GFR, Estimated: 49 mL/min — ABNORMAL LOW (ref >60)
Glucose, Bld: 99 mg/dL (ref 70–99)
Potassium: 3.7 mmol/L (ref 3.5–5.1)
Sodium: 144 mmol/L (ref 135–145)

## 2022-06-02 LAB — CORTISOL-AM, BLOOD: Cortisol - AM: 19.2 ug/dL (ref 6.7–22.6)

## 2022-06-02 LAB — PROTIME-INR
INR: 1.3 — ABNORMAL HIGH (ref 0.8–1.2)
Prothrombin Time: 16.4 seconds — ABNORMAL HIGH (ref 11.4–15.2)

## 2022-06-02 LAB — PROCALCITONIN: Procalcitonin: 16.77 ng/mL

## 2022-06-02 MED ORDER — IPRATROPIUM-ALBUTEROL 0.5-2.5 (3) MG/3ML IN SOLN
3.0000 mL | Freq: Three times a day (TID) | RESPIRATORY_TRACT | Status: DC
  Administered 2022-06-02 – 2022-06-03 (×3): 3 mL via RESPIRATORY_TRACT
  Filled 2022-06-02 (×3): qty 3

## 2022-06-02 NOTE — Progress Notes (Incomplete)
       CROSS COVER NOTE  NAME: Monique Johnson MRN: [id number] DOB : 1941-03-21    Date of Service     HPI/Events of Note     Interventions   Plan: Hold PM Cozaar X X      This document was prepared using Dragon voice recognition software and may include unintentional dictation errors.  Bishop Limbo DNP, MHA, FNP-BC Nurse Practitioner [facility] Pager [phone]

## 2022-06-02 NOTE — Assessment & Plan Note (Signed)
She will better with IV normal saline and will follow BMP. - We will avoid nephrotoxins.

## 2022-06-02 NOTE — Assessment & Plan Note (Signed)
-   We will continue Aricept and Seroquel. 

## 2022-06-02 NOTE — Assessment & Plan Note (Addendum)
-   This is associated with multifocal pneumonia. - Sepsis manifested by significant Oxydose and mild tachypnea. - The patient will be admitted to a medical telemetry bed. - We will continue antibiotic therapy with IV Rocephin and Zithromax. - Mucolytic therapy will be provided. - Bronchodilator therapy will be given. - O2 protocol will be followed.

## 2022-06-02 NOTE — Assessment & Plan Note (Signed)
-   We will continue allopurinol 

## 2022-06-02 NOTE — Progress Notes (Signed)
  Progress Note   Patient: Monique Johnson HGD:[id number] DOB: [birth year] DOA: 06/01/2022     1 DOS: the patient was seen and examined on 06/02/2022   Brief hospital course: Kensley Valladares is a 81 y.o. female with medical history significant for essential hypertension, Alzheimer's dementia, gout and prediabetes, presented to the emergency room with chief complaint of altered mental status with increased somnolence and difficulty to be aroused.  The patient has baseline advanced dementia but has been more sleepy per her daughter.  She was found hypotensive after SNF with a BP of 76/54.  She was started on IV lactated Ringer and brought to the emergency room. Upon arriving the hospital, she was hypotensive, received IV fluid bolus.  She also had a tachycardia and leukocytosis and acute renal failure.  Consistent with severe sepsis. Chest x-ray showed bilateral multifocal pneumonia, she is started on Rocephin and Zithromax.  Assessment and Plan: Severe sepsis secondary to pneumonia. Multifocal pneumonia. Acute metabolic encephalopathy secondary to pneumonia. Hypotension secondary to severe sepsis. Acute kidney injury secondary to severe sepsis. Currently, patient is on room air. Patient has clinical evidence of severe sepsis and pneumonia. Her mental status seems to be improving today, continue antibiotics with Rocephin and Zithromax.  Follow-up on blood culture results. Blood pressure is more stable, will discontinue IV fluids.  Renal function also getting better.  Dementia with behavioral disturbance. Continue Aricept and Seroquel.  Essential hypertension. Hold blood pressure medicine for hypotension.       Subjective:  Patient seem to have confusion which appear to be chronic. No agitation. No short of breath today.  Physical Exam: Vitals:   06/01/22 2252 06/02/22 0514 06/02/22 0714 06/02/22 0729  BP: (!) 106/51 (!) 98/51 (!) 101/55   Pulse: 74 63 63   Resp: 20 20 16    Temp: 97.8  F (36.6 C) 98.4 F (36.9 C) 97.7 F (36.5 C)   TempSrc:      SpO2: 100% 94% 90% 91%  Weight: 64.2 kg     Height: 5\' 3"  (1.6 m)      General exam: Appears calm and comfortable  Respiratory system: Clear to auscultation. Respiratory effort normal. Cardiovascular system: S1 & S2 heard, RRR. No JVD, murmurs, rubs, gallops or clicks. No pedal edema. Gastrointestinal system: Abdomen is nondistended, soft and nontender. No organomegaly or masses felt. Normal bowel sounds heard. Central nervous system: Alert and oriented x1. No focal neurological deficits. Extremities: Symmetric 5 x 5 power. Skin: No rashes, lesions or ulcers   Data Reviewed:  Reviewed chest x-ray report and image, reviewed all lab results.  Family Communication: Could not reach sister over the phone  Disposition: Status is: Inpatient Remains inpatient appropriate because: Severity of disease, IV antibiotic treatment.  Planned Discharge Destination: Skilled nursing facility    Time spent: 50 minutes  Author: , MD 06/02/2022 11:13 AM  For on call review [email or web address].

## 2022-06-02 NOTE — Assessment & Plan Note (Signed)
-   The patient will be placed on as needed IV hydralazine. - We will hold off Cozaar given acute kidney injury.

## 2022-06-02 NOTE — Hospital Course (Signed)
Monique Johnson is a 81 y.o. female with medical history significant for essential hypertension, Alzheimer's dementia, gout and prediabetes, presented to the emergency room with chief complaint of altered mental status with increased somnolence and difficulty to be aroused.  The patient has baseline advanced dementia but has been more sleepy per her daughter.  She was found hypotensive after SNF with a BP of 76/54.  She was started on IV lactated Ringer and brought to the emergency room. Upon arriving the hospital, she was hypotensive, received IV fluid bolus.  She also had a tachycardia and leukocytosis and acute renal failure.  Consistent with severe sepsis. Chest x-ray showed bilateral multifocal pneumonia, she is started on Rocephin and Zithromax.

## 2022-06-03 DIAGNOSIS — G9341 Metabolic encephalopathy: Secondary | ICD-10-CM | POA: Diagnosis not present

## 2022-06-03 DIAGNOSIS — A419 Sepsis, unspecified organism: Secondary | ICD-10-CM | POA: Diagnosis not present

## 2022-06-03 DIAGNOSIS — J189 Pneumonia, unspecified organism: Secondary | ICD-10-CM | POA: Diagnosis not present

## 2022-06-03 DIAGNOSIS — N179 Acute kidney failure, unspecified: Secondary | ICD-10-CM | POA: Diagnosis not present

## 2022-06-03 LAB — URINE CULTURE: Culture: >=100000 — AB

## 2022-06-03 LAB — BASIC METABOLIC PANEL
Anion gap: 4 — ABNORMAL LOW (ref 5–15)
BUN: 35 mg/dL — ABNORMAL HIGH (ref 8–23)
CO2: 22 mmol/L (ref 22–32)
Calcium: 8.6 mg/dL — ABNORMAL LOW (ref 8.9–10.3)
Chloride: 116 mmol/L — ABNORMAL HIGH (ref 98–111)
Creatinine, Ser: 0.99 mg/dL (ref 0.44–1.00)
GFR, Estimated: 58 mL/min — ABNORMAL LOW (ref >60)
Glucose, Bld: 113 mg/dL — ABNORMAL HIGH (ref 70–99)
Potassium: 3.6 mmol/L (ref 3.5–5.1)
Sodium: 142 mmol/L (ref 135–145)

## 2022-06-03 LAB — MAGNESIUM: Magnesium: 1.8 mg/dL (ref 1.7–2.4)

## 2022-06-03 MED ORDER — IPRATROPIUM-ALBUTEROL 0.5-2.5 (3) MG/3ML IN SOLN: 3.0000 mL | RESPIRATORY_TRACT | Status: DC | PRN

## 2022-06-03 NOTE — NC FL2 (Signed)
[place] MEDICAID FL2 LEVEL OF CARE SCREENING TOOL     IDENTIFICATION  Patient Name: Monique Johnson Birthdate: 1941-03-26 Sex: female Admission Date (Current Location): 06/01/2022  [facility] and [place] Number:  Chiropodist and Address:  [facility], [address] [place], [place] [address]      Provider Number: [id number]  Attending Physician Name and Address:  Marrion Coy, MD  Relative Name and Phone Number:       Current Level of Care: Hospital Recommended Level of Care: Assisted Living Facility Prior Approval Number:    Date Approved/Denied:   PASRR Number:    Discharge Plan: Other (Comment) (ALF)    Current Diagnoses: Patient Active Problem List   Diagnosis Date Noted   Essential hypertension 06/02/2022   AKI (acute kidney injury) (HCC) 06/02/2022   Dementia with behavioral disturbance (HCC) 06/02/2022   Gout 06/02/2022   Multifocal pneumonia 06/02/2022   Acute metabolic encephalopathy 06/02/2022   Hypotension 06/02/2022   Severe sepsis (HCC) 06/01/2022    Orientation RESPIRATION BLADDER Height & Weight     Self  Normal Incontinent Weight: 141 lb 8.6 oz (64.2 kg) Height:  5\' 3"  (160 cm)  BEHAVIORAL SYMPTOMS/MOOD NEUROLOGICAL BOWEL NUTRITION STATUS      Continent Diet (heart healthy, thin liquids)  AMBULATORY STATUS COMMUNICATION OF NEEDS Skin   Limited Assist Verbally Normal                       Personal Care Assistance Level of Assistance  Bathing, Feeding, Dressing Bathing Assistance: Limited assistance Feeding assistance: Independent Dressing Assistance: Limited assistance     Functional Limitations Info  Sight, Hearing, Speech Sight Info: Adequate Hearing Info: Adequate Speech Info: Adequate    SPECIAL CARE FACTORS FREQUENCY  PT (By licensed PT), OT (By licensed OT)     PT Frequency: 2x OT Frequency: 2x            Contractures Contractures Info: Not present    Additional Factors Info   Code Status, Allergies Code Status Info: full code Allergies Info: Levofloxacin           Current Medications (06/03/2022):  This is the current hospital active medication list Current Facility-Administered Medications  Medication Dose Route Frequency Provider Last Rate Last Admin   acetaminophen (TYLENOL) tablet 650 mg  650 mg Oral Q6H PRN Mansy, Jan A, MD       Or   acetaminophen (TYLENOL) suppository 650 mg  650 mg Rectal Q6H PRN Mansy, Jan A, MD       allopurinol (ZYLOPRIM) tablet 100 mg  100 mg Oral Daily Mansy, Jan A, MD   100 mg at 06/03/22 1109   azithromycin (ZITHROMAX) 500 mg in sodium chloride 0.9 % 250 mL IVPB  500 mg Intravenous Q24H Mansy, Jan A, MD 250 mL/hr at 06/02/22 2102 500 mg at 06/02/22 2102   cefTRIAXone (ROCEPHIN) 2 g in sodium chloride 0.9 % 100 mL IVPB  2 g Intravenous Q24H Mansy, Jan A, MD 200 mL/hr at 06/03/22 0934 2 g at 06/03/22 0934   chlorpheniramine-HYDROcodone 10-8 MG/5ML suspension 5 mL  5 mL Oral Q12H PRN Mansy, Jan A, MD       enoxaparin (LOVENOX) injection 40 mg  40 mg Subcutaneous Q24H Mansy, Jan A, MD   40 mg at 06/03/22 1109   guaiFENesin (MUCINEX) 12 hr tablet 600 mg  600 mg Oral BID Mansy, Jan A, MD   600 mg at 06/03/22 1109  ipratropium-albuterol (DUONEB) 0.5-2.5 (3) MG/3ML nebulizer solution 3 mL  3 mL Nebulization Q4H PRN Marrion Coy, MD       LORazepam (ATIVAN) tablet 0.5 mg  0.5 mg Oral BID PRN Mansy, Jan A, MD   0.5 mg at 06/02/22 1535   losartan (COZAAR) tablet 50 mg  50 mg Oral BID Mansy, Jan A, MD   50 mg at 06/03/22 1109   magnesium hydroxide (MILK OF MAGNESIA) suspension 30 mL  30 mL Oral Daily PRN Mansy, Jan A, MD       memantine [facility]) tablet 5 mg  5 mg Oral BID Mansy, Jan A, MD   5 mg at 06/03/22 1109   ondansetron (ZOFRAN) tablet 4 mg  4 mg Oral Q6H PRN Mansy, Jan A, MD       Or   ondansetron [facility]) injection 4 mg  4 mg Intravenous Q6H PRN Mansy, Jan A, MD       QUEtiapine (SEROQUEL) tablet 25 mg  25 mg Oral QHS Mansy, Jan A,  MD   25 mg at 06/02/22 2004   traZODone (DESYREL) tablet 25 mg  25 mg Oral QHS PRN Mansy, Jan A, MD   25 mg at 06/02/22 2004   zinc sulfate capsule 220 mg  220 mg Oral Daily Mansy, Jan A, MD   220 mg at 06/03/22 1109     Discharge Medications: Please see discharge summary for a list of discharge medications.  Relevant Imaging Results:  Relevant Lab Results:   Additional Information SSN:[id number]  Maree Krabbe, LCSW

## 2022-06-03 NOTE — Progress Notes (Signed)
  Progress Note   Patient: Monique Johnson AOZ:[id number] DOB: 04/11/1941 DOA: 06/01/2022     2 DOS: the patient was seen and examined on 06/03/2022   Brief hospital course: Monique Johnson is a 81 y.o. female with medical history significant for essential hypertension, Alzheimer's dementia, gout and prediabetes, presented to the emergency room with chief complaint of altered mental status with increased somnolence and difficulty to be aroused.  The patient has baseline advanced dementia but has been more sleepy per her daughter.  She was found hypotensive after SNF with a BP of 76/54.  She was started on IV lactated Ringer and brought to the emergency room. Upon arriving the hospital, she was hypotensive, received IV fluid bolus.  She also had a tachycardia and leukocytosis and acute renal failure.  Consistent with severe sepsis. Chest x-ray showed bilateral multifocal pneumonia, she is started on Rocephin and Zithromax.  Assessment and Plan: Severe sepsis secondary to pneumonia. Multifocal pneumonia. Acute metabolic encephalopathy secondary to pneumonia. Hypotension secondary to severe sepsis. Acute kidney injury secondary to severe sepsis. Patient has clinical evidence of severe sepsis and pneumonia. Her mental status has improved, renal function also improved.  Blood culture came back no growth. Continue antibiotics with Rocephin and Zithromax. Patient came from assisted living facility, will obtain PT/OT.  May be able to return tomorrow if okay with PT.  Lactobacillus UTI. Continue above antibiotics.   Dementia with behavioral disturbance. Continue Aricept and Seroquel.  Essential hypertension. Continue hold blood pressure medicines.        Subjective:  Patient has baseline confusion, no agitation. No shortness of breath or cough.  Physical Exam: Vitals:   06/02/22 2001 06/03/22 0553 06/03/22 0708 06/03/22 0810  BP: (!) 110/49 (!) 131/56  (!) 127/55  Pulse: (!) 102 68  73   Resp:  18  19  Temp: 100 F (37.8 C) 97.7 F (36.5 C)  98.7 F (37.1 C)  TempSrc:      SpO2: 93% 90% 92% 94%  Weight:      Height:       General exam: Appears calm and comfortable  Respiratory system: Clear to auscultation. Respiratory effort normal. Cardiovascular system: S1 & S2 heard, RRR. No JVD, murmurs, rubs, gallops or clicks. No pedal edema. Gastrointestinal system: Abdomen is nondistended, soft and nontender. No organomegaly or masses felt. Normal bowel sounds heard. Central nervous system: Alert and oriented x1. No focal neurological deficits. Extremities: Symmetric 5 x 5 power. Skin: No rashes, lesions or ulcers   Data Reviewed:  Blood  culture negative, urine culture has lactobacillus.  Family Communication: Sister updated at bedside.  Disposition: Status is: Inpatient Remains inpatient appropriate because: Severity of disease, IV antibiotics.  Planned Discharge Destination:  ALF vs SNF    Time spent: 35 minutes  Author: Marrion Coy, MD 06/03/2022 1:52 PM  For on call review [email or web address].

## 2022-06-04 DIAGNOSIS — A419 Sepsis, unspecified organism: Secondary | ICD-10-CM | POA: Diagnosis not present

## 2022-06-04 DIAGNOSIS — J189 Pneumonia, unspecified organism: Secondary | ICD-10-CM | POA: Diagnosis not present

## 2022-06-04 DIAGNOSIS — G9341 Metabolic encephalopathy: Secondary | ICD-10-CM | POA: Diagnosis not present

## 2022-06-04 DIAGNOSIS — I959 Hypotension, unspecified: Secondary | ICD-10-CM

## 2022-06-04 DIAGNOSIS — N179 Acute kidney failure, unspecified: Secondary | ICD-10-CM | POA: Diagnosis not present

## 2022-06-04 LAB — BASIC METABOLIC PANEL
Anion gap: 4 — ABNORMAL LOW (ref 5–15)
BUN: 23 mg/dL (ref 8–23)
CO2: 25 mmol/L (ref 22–32)
Calcium: 9.3 mg/dL (ref 8.9–10.3)
Chloride: 114 mmol/L — ABNORMAL HIGH (ref 98–111)
Creatinine, Ser: 0.84 mg/dL (ref 0.44–1.00)
GFR, Estimated: >60 mL/min (ref >60)
Glucose, Bld: 92 mg/dL (ref 70–99)
Potassium: 4 mmol/L (ref 3.5–5.1)
Sodium: 143 mmol/L (ref 135–145)

## 2022-06-04 LAB — CBC WITH DIFFERENTIAL/PLATELET
Abs Immature Granulocytes: 0.17 10*3/uL — ABNORMAL HIGH (ref 0.00–0.07)
Basophils Absolute: 0 10*3/uL (ref 0.0–0.1)
Basophils Relative: 0 %
Eosinophils Absolute: 0.1 10*3/uL (ref 0.0–0.5)
Eosinophils Relative: 1 %
HCT: 33.5 % — ABNORMAL LOW (ref 36.0–46.0)
Hemoglobin: 11 g/dL — ABNORMAL LOW (ref 12.0–15.0)
Immature Granulocytes: 2 %
Lymphocytes Relative: 11 %
Lymphs Abs: 0.9 10*3/uL (ref 0.7–4.0)
MCH: 30.4 pg (ref 26.0–34.0)
MCHC: 32.8 g/dL (ref 30.0–36.0)
MCV: 92.5 fL (ref 80.0–100.0)
Monocytes Absolute: 0.7 10*3/uL (ref 0.1–1.0)
Monocytes Relative: 8 %
Neutro Abs: 6.5 10*3/uL (ref 1.7–7.7)
Neutrophils Relative %: 78 %
Platelets: 197 10*3/uL (ref 150–400)
RBC: 3.62 MIL/uL — ABNORMAL LOW (ref 3.87–5.11)
RDW: 12.8 % (ref 11.5–15.5)
WBC: 8.3 10*3/uL (ref 4.0–10.5)
nRBC: 0 % (ref 0.0–0.2)

## 2022-06-04 MED ORDER — AMOXICILLIN-POT CLAVULANATE 875-125 MG PO TABS: 1.0000 | ORAL_TABLET | Freq: Two times a day (BID) | ORAL | 0 refills | Status: DC

## 2022-06-04 MED ORDER — AMOXICILLIN-POT CLAVULANATE 875-125 MG PO TABS
1.0000 | ORAL_TABLET | Freq: Two times a day (BID) | ORAL | Status: DC
Start: 2022-06-05 — End: 2022-06-04

## 2022-06-04 NOTE — NC FL2 (Signed)
  [place] MEDICAID FL2 LEVEL OF CARE SCREENING TOOL     IDENTIFICATION  Patient Name: Monique Johnson Birthdate: Jul 14, 1941 Sex: female Admission Date (Current Location): 06/01/2022  [facility] and [place] Number:  Chiropodist and Address:  [facility], [address], [place], [place] [address]      Provider Number: [id number]  Attending Physician Name and Address:  Lurene Shadow, MD  Relative Name and Phone Number:       Current Level of Care: Hospital Recommended Level of Care: Assisted Living Facility Prior Approval Number:    Date Approved/Denied:   PASRR Number:    Discharge Plan: Other (Comment) (ALF)    Current Diagnoses: Patient Active Problem List   Diagnosis Date Noted   Essential hypertension 06/02/2022   AKI (acute kidney injury) (HCC) 06/02/2022   Dementia with behavioral disturbance (HCC) 06/02/2022   Gout 06/02/2022   Multifocal pneumonia 06/02/2022   Acute metabolic encephalopathy 06/02/2022   Hypotension 06/02/2022   Severe sepsis (HCC) 06/01/2022    Orientation RESPIRATION BLADDER Height & Weight     Self  Normal Incontinent Weight: 141 lb 8.6 oz (64.2 kg) Height:  5\' 3"  (160 cm)  BEHAVIORAL SYMPTOMS/MOOD NEUROLOGICAL BOWEL NUTRITION STATUS      Continent Diet (heart healthy, thin liquids)  AMBULATORY STATUS COMMUNICATION OF NEEDS Skin   Limited Assist Verbally Normal                       Personal Care Assistance Level of Assistance  Bathing, Feeding, Dressing Bathing Assistance: Limited assistance Feeding assistance: Independent Dressing Assistance: Limited assistance     Functional Limitations Info  Sight, Hearing, Speech Sight Info: Adequate Hearing Info: Adequate Speech Info: Adequate    SPECIAL CARE FACTORS FREQUENCY  PT (By licensed PT), OT (By licensed OT)     PT Frequency: 2x OT Frequency: 2x            Contractures Contractures Info: Not present    Additional Factors Info   Code Status, Allergies Code Status Info: full code Allergies Info: Levofloxacin               Discharge Medications: allopurinol 100 MG tablet Commonly known as: ZYLOPRIM Take 100 mg by mouth daily.    amoxicillin-clavulanate 875-125 MG tablet Commonly known as: AUGMENTIN Take 1 tablet by mouth 2 (two) times daily for 3 days.    CALCIUM CARBONATE-VITAMIN D3 PO Take 1 capsule by mouth daily.    LORazepam 0.5 MG tablet Commonly known as: ATIVAN Take 0.5 mg by mouth 2 (two) times daily as needed.    losartan 50 MG tablet Commonly known as: COZAAR Take 50 mg by mouth 2 (two) times daily.    memantine 5 MG tablet Commonly known as: NAMENDA Take 5 mg by mouth 2 (two) times daily.    MULTIVITAMIN/IRON PO Take 1 tablet by mouth daily.    QUEtiapine 25 MG tablet Commonly known as: SEROQUEL Take 25 mg by mouth at bedtime.    Zinc 220 (50 Zn) MG Caps Take 1 capsule by mouth daily.      Relevant Imaging Results:  Relevant Lab Results:   Additional Information SSN:[id number]  12-27-1994, LCSW

## 2022-06-04 NOTE — TOC Progression Note (Addendum)
Transition of Care [facility]) - Progression Note    Patient Details  Name: Monique Johnson MRN: [id number] Date of Birth: 1941-08-26  Transition of Care [facility]) CM/SW Contact  Maree Krabbe, LCSW Phone Number: 06/04/2022, 2:23 PM  Clinical Narrative:   CSW received confirmation that [facility] has completed assessment and will take pt back today. Will need HH orders. MD notified.   Pt's sister Darel Hong will transport pt back to ALF. CSW to fax ppwk to [phone].       Expected Discharge Plan and Services                                                 Social Determinants of Health (SDOH) Interventions    Readmission Risk Interventions     No data to display

## 2022-06-04 NOTE — Progress Notes (Signed)
Called report  to [facility] spoke to [facility]. No questions at this time.

## 2022-06-04 NOTE — TOC Progression Note (Signed)
Transition of Care [facility]) - Progression Note    Patient Details  Name: Monique Johnson MRN: [id number] Date of Birth: [birth year]  Transition of Care [facility]) CM/SW Contact  Maree Krabbe, LCSW Phone Number: 06/04/2022, 1:31 PM  Clinical Narrative:   Ancil Boozer coming to eval pt today. This must occur before pt can return back.         Expected Discharge Plan and Services                                                 Social Determinants of Health (SDOH) Interventions    Readmission Risk Interventions     No data to display

## 2022-06-04 NOTE — Care Management Important Message (Signed)
Important Message  Patient Details  Name: Monique Johnson MRN: [id number] Date of Birth: 27-Sep-1941   Medicare Important Message Given:  Yes  I reviewed the Important Message from Medicare with the patient's sister, [name] by phone [phone]) and she is in agreement with the discharge. She asked when she could pick her up to transfer back the facility and fowarded the call to [facility], LCSW.  I wished her sister well and thanked her for her time.   Olegario Messier A Wylder Macomber 06/04/2022, 2:45 PM

## 2022-06-04 NOTE — Discharge Summary (Signed)
Physician Discharge Summary   Patient: Monique Johnson MRN: [id number] DOB: [birth year]  Admit date:     06/01/2022  Discharge date: 06/04/22  Discharge Physician: Lurene Shadow   PCP: Lauro Regulus, MD   Recommendations at discharge:   Follow-up with PCP in 1 week  Discharge Diagnoses: Principal Problem:   Severe sepsis (HCC) Active Problems:   AKI (acute kidney injury) (HCC)   Essential hypertension   Dementia with behavioral disturbance (HCC)   Gout   Multifocal pneumonia   Acute metabolic encephalopathy   Hypotension  Resolved Problems:   * No resolved hospital problems. *  Hospital Course: Monique Johnson is a 81 y.o. female with medical history significant for essential hypertension, Alzheimer's dementia, gout and prediabetes, presented to the emergency room with chief complaint of altered mental status with increased somnolence and difficulty to be aroused.  The patient has baseline advanced dementia but had been more sleepy per her daughter.  She was found hypotensive at ALF with a BP of 76/54.  She was started on IV lactated RingerS and brought to the emergency room.  She was found to have severe sepsis secondary to multifocal pneumonia complicated by acute metabolic encephalopathy, hypotension and AKI.  She was treated with IV ceftriaxone and azithromycin.  Strep pneumoniae urine antigen was positive consistent with pneumococcal pneumonia.  She was evaluated by PT and OT recommended home health therapy.  Her condition has improved and she is deemed stable for discharge to home today.  Discharge plan was discussed with the patient and Darel Hong, sister, at the bedside.        Consultants: None Procedures performed: None Disposition: Assisted living Diet recommendation:  Cardiac diet DISCHARGE MEDICATION: Allergies as of 06/04/2022       Reactions   Levofloxacin Other (See Comments)   RED AND HOT        Medication List     TAKE these medications     allopurinol 100 MG tablet Commonly known as: ZYLOPRIM Take 100 mg by mouth daily.   amoxicillin-clavulanate 875-125 MG tablet Commonly known as: AUGMENTIN Take 1 tablet by mouth 2 (two) times daily for 3 days.   CALCIUM CARBONATE-VITAMIN D3 PO Take 1 capsule by mouth daily.   LORazepam 0.5 MG tablet Commonly known as: ATIVAN Take 0.5 mg by mouth 2 (two) times daily as needed.   losartan 50 MG tablet Commonly known as: COZAAR Take 50 mg by mouth 2 (two) times daily.   memantine 5 MG tablet Commonly known as: NAMENDA Take 5 mg by mouth 2 (two) times daily.   MULTIVITAMIN/IRON PO Take 1 tablet by mouth daily.   QUEtiapine 25 MG tablet Commonly known as: SEROQUEL Take 25 mg by mouth at bedtime.   Zinc 220 (50 Zn) MG Caps Take 1 capsule by mouth daily.        Discharge Exam: Filed Weights   06/01/22 1903 06/01/22 2252  Weight: 68 kg 64.2 kg   GEN: NAD SKIN: Warm and dry EYES: No pallor or icterus ENT: MMM CV: RRR PULM: CTA B ABD: soft, ND, NT, +BS CNS: AAO x 1 (person only), non focal EXT: No edema or tenderness   Condition at discharge: good  The results of significant diagnostics from this hospitalization (including imaging, microbiology, ancillary and laboratory) are listed below for reference.   Imaging Studies: CT HEAD WO CONTRAST ( )  Result Date: 06/01/2022 CLINICAL DATA:  Head trauma, minor (Age >= 65y) Dementia patient, more alter than usual. EXAM: CT HEAD WITHOUT  CONTRAST TECHNIQUE: Contiguous axial images were obtained from the base of the skull through the vertex without intravenous contrast. RADIATION DOSE REDUCTION: This exam was performed according to the departmental dose-optimization program which includes automated exposure control, adjustment of the mA and/or kV according to patient size and/or use of iterative reconstruction technique. COMPARISON:  Brain MRI 12/16/2019 FINDINGS: Brain: No intracranial hemorrhage, mass effect, or midline  shift. Normal for age brain volume. No hydrocephalus. The basilar cisterns are patent. Mild periventricular chronic small vessel ischemia. No evidence of territorial infarct or acute ischemia. No extra-axial or intracranial fluid collection. Vascular: No hyperdense vessel or unexpected calcification. Skull: No fracture or focal lesion.  Calvarial hyperostosis. Sinuses/Orbits: Paranasal sinuses and mastoid air cells are clear. The visualized orbits are unremarkable. Bilateral cataract resection. Other: None. IMPRESSION: 1. No acute intracranial abnormality. No skull fracture. 2. Mild chronic small vessel ischemia. Electronically Signed   By: Narda Rutherford M.D.   On: 06/01/2022 21:36   DG Chest Port 1 View  Result Date: 06/01/2022 CLINICAL DATA:  Questionable sepsis - evaluate for abnormality Altered mental status. EXAM: PORTABLE CHEST 1 VIEW COMPARISON:  None Available. FINDINGS: Lung volumes are low. Mild cardiomegaly. Tortuous thoracic aorta. Multifocal airspace consolidation, most prominent involving the right upper lobe, lesser within both lung bases. No pulmonary edema. Previous small right pleural effusion. No pneumothorax. IMPRESSION: 1. Multifocal airspace consolidation throughout both lungs, greatest involving the right upper lobe, suspicious for multifocal pneumonia. Recommend radiographic follow-up to ensure resolution after course of treatment to exclude the possibility of underlying neoplastic process. 2. Cardiomegaly with tortuous thoracic aorta. Electronically Signed   By: Narda Rutherford M.D.   On: 06/01/2022 20:33    Microbiology: Results for orders placed or performed during the hospital encounter of 06/01/22  Resp Panel by RT-PCR (Flu A&B, Covid) Anterior Nasal Swab     Status: None   Collection Time: 06/01/22  8:07 PM   Specimen: Anterior Nasal Swab  Result Value Ref Range Status   SARS Coronavirus 2 by RT PCR NEGATIVE NEGATIVE Final    Comment: (NOTE) SARS-CoV-2 target nucleic  acids are NOT DETECTED.  The SARS-CoV-2 RNA is generally detectable in upper respiratory specimens during the acute phase of infection. The lowest concentration of SARS-CoV-2 viral copies this assay can detect is 138 copies/mL. A negative result does not preclude SARS-Cov-2 infection and should not be used as the sole basis for treatment or other patient management decisions. A negative result may occur with  improper specimen collection/handling, submission of specimen other than nasopharyngeal swab, presence of viral mutation(s) within the areas targeted by this assay, and inadequate number of viral copies(<138 copies/mL). A negative result must be combined with clinical observations, patient history, and epidemiological information. The expected result is Negative.  Fact Sheet for Patients:  BloggerCourse.com  Fact Sheet for Healthcare Providers:  SeriousBroker.it  This test is no t yet approved or cleared by the [place] FDA and  has been authorized for detection and/or diagnosis of SARS-CoV-2 by FDA under an Emergency Use Authorization (EUA). This EUA will remain  in effect (meaning this test can be used) for the duration of the COVID-19 declaration under Section 564(b)(1) of the Act, 21 U.S.C.section 360bbb-3(b)(1), unless the authorization is terminated  or revoked sooner.       Influenza A by PCR NEGATIVE NEGATIVE Final   Influenza B by PCR NEGATIVE NEGATIVE Final    Comment: (NOTE) The Xpert Xpress SARS-CoV-2/FLU/RSV plus assay is intended as an aid in  the diagnosis of influenza from Nasopharyngeal swab specimens and should not be used as a sole basis for treatment. Nasal washings and aspirates are unacceptable for Xpert Xpress SARS-CoV-2/FLU/RSV testing.  Fact Sheet for Patients: BloggerCourse.com  Fact Sheet for Healthcare Providers: SeriousBroker.it  This  test is not yet approved or cleared by the [place] FDA and has been authorized for detection and/or diagnosis of SARS-CoV-2 by FDA under an Emergency Use Authorization (EUA). This EUA will remain in effect (meaning this test can be used) for the duration of the COVID-19 declaration under Section 564(b)(1) of the Act, 21 U.S.C. section 360bbb-3(b)(1), unless the authorization is terminated or revoked.  Performed at [facility], [address] [place], [place] [address]   Blood Culture (routine x 2)     Status: None (Preliminary result)   Collection Time: 06/01/22  8:07 PM   Specimen: BLOOD  Result Value Ref Range Status   Specimen Description BLOOD BLRA  Final   Special Requests BOTTLES DRAWN AEROBIC AND ANAEROBIC BCLV  Final   Culture   Final    NO GROWTH 3 DAYS Performed at [facility], [address] [place], [place] [address]    Report Status PENDING  Incomplete  Blood Culture (routine x 2)     Status: None (Preliminary result)   Collection Time: 06/01/22  8:09 PM   Specimen: BLOOD  Result Value Ref Range Status   Specimen Description BLOOD BLLA  Final   Special Requests BOTTLES DRAWN AEROBIC AND ANAEROBIC BCLV  Final   Culture   Final    NO GROWTH 3 DAYS Performed at [facility], [address] [place], [place] [address]    Report Status PENDING  Incomplete  Urine Culture     Status: Abnormal   Collection Time: 06/01/22  8:09 PM   Specimen: In/Out Cath Urine  Result Value Ref Range Status   Specimen Description   Final    IN/OUT CATH URINE Performed at [facility], [address] [place], [place] [address]    Special Requests   Final    NONE Performed at [facility], [address] [place], [place] [address]    Culture (A)  Final    >=100,000 COLONIES/mL LACTOBACILLUS SPECIES Standardized susceptibility testing for this organism is not available. Performed at [facility], [address],  [place], [place] [address]    Report Status 06/03/2022 FINAL  Final    Labs: CBC: Recent Labs  Lab 06/01/22 1915 06/02/22 0428 06/04/22 0823  WBC 18.2* 13.0* 8.3  NEUTROABS 16.0*  --  6.5  HGB 10.9* 10.0* 11.0*  HCT 33.9* 31.2* 33.5*  MCV 94.4 93.7 92.5  PLT 176 170 197   Basic Metabolic Panel: Recent Labs  Lab 06/01/22 1915 06/02/22 0428 06/03/22 0545 06/04/22 0823  NA 143 144 142 143  K 3.9 3.7 3.6 4.0  CL 109 116* 116* 114*  CO2 26 24 22 25   GLUCOSE 124* 99 113* 92  BUN 53* 48* 35* 23  CREATININE 1.52* 1.15* 0.99 0.84  CALCIUM 9.6 9.2 8.6* 9.3  MG  --   --  1.8  --    Liver Function Tests: Recent Labs  Lab 06/01/22 1915  AST 25  ALT 14  ALKPHOS 56  BILITOT 0.7  PROT 5.7*  ALBUMIN 2.8*   CBG: No results for input(s): "GLUCAP" in the last 168 hours.  Discharge time spent: greater than 30 minutes.  Signed: 08/02/22, MD [facility] 06/04/2022

## 2022-06-04 NOTE — Evaluation (Signed)
Physical Therapy Evaluation Patient Details Name: Monique Johnson MRN: [id number] DOB: 03-19-1941 Today's Date: 06/04/2022  History of Present Illness  Pt is a 81 y.o. female who presented to emergency room with AMS with increased somnolence. PMH: essential hypertension, Alzheimer's dementia, gout and prediabetes.  Clinical Impression  Patient was pleasant and cooperative with PT evaluation. She has history of cognitive impairments but is able to follow single step commands with increased time. She is a poor historian, but the chart indicates patient lives at ALF.  Today the patient required assistance for bed mobility and transfers. Increased independence with repeated transfers. She ambulated a short distance in the room with assistance. Recommend to continue PT to maximize independence and decrease caregiver burden. Anticipate patient can return back to previous ALF with staff assistance with home health PT recommended.      Recommendations for follow up therapy are one component of a multi-disciplinary discharge planning process, led by the attending physician.  Recommendations may be updated based on patient status, additional functional criteria and insurance authorization.  Follow Up Recommendations Home health PT (return to ALF with intermittent assistance from staff and home health PT)      Assistance Recommended at Discharge Intermittent Supervision/Assistance  Patient can return home with the following  A little help with walking and/or transfers;A little help with bathing/dressing/bathroom;Direct supervision/assist for medications management;Direct supervision/assist for financial management;Assist for transportation    Equipment Recommendations None recommended by PT  Recommendations for Other Services       Functional Status Assessment Patient has had a recent decline in their functional status and demonstrates the ability to make significant improvements in function in a  reasonable and predictable amount of time.     Precautions / Restrictions Precautions Precautions: Fall Restrictions Weight Bearing Restrictions: No      Mobility  Bed Mobility Overal bed mobility: Needs Assistance Bed Mobility: Supine to Sit     Supine to sit: Mod assist     General bed mobility comments: assistance for LE and trunk support. verbal cues for sequencing and technique    Transfers Overall transfer level: Needs assistance Equipment used: Rolling walker (2 wheels), None Transfers: Sit to/from Stand, Bed to chair/wheelchair/BSC Sit to Stand:  (Max A initially progressing to Min A +2)   Step pivot transfers: Min assist, +2 physical assistance       General transfer comment: multiple standing bouts performed from the bed with and without rolling walker. verbal cues for hand placement and technique. lifting and lowering assistance provided. patient improved with independence with transfers with increased activity    Ambulation/Gait Ambulation/Gait assistance: Min assist Gait Distance (Feet): 25 Feet Assistive device: 2 person hand held assist Gait Pattern/deviations: Step-to pattern, Narrow base of support, Trunk flexed Gait velocity: decreased     General Gait Details: patient ambulated in the room with assistance for steadying and cues for LE sequencing. activity tolerance limited by fatigue  Stairs            Wheelchair Mobility    Modified Rankin (Stroke Patients Only)       Balance Overall balance assessment: Needs assistance Sitting-balance support: No upper extremity supported Sitting balance-Leahy Scale: Fair Sitting balance - Comments: no loss of balance with static sitting. stand by assistance provided for safety                                     Pertinent Vitals/Pain  Pain Assessment Pain Assessment: No/denies pain    Home Living Family/patient expects to be discharged to:: Assisted living [facility])                         Prior Function Prior Level of Function : Patient poor historian/Family not available             Mobility Comments: patient reports she has no difficulty with ambulation at baseline however patient is a poor historian with history of cognitive impairments       Hand Dominance        Extremity/Trunk Assessment   Upper Extremity Assessment Upper Extremity Assessment: Generalized weakness    Lower Extremity Assessment Lower Extremity Assessment: Generalized weakness       Communication   Communication: No difficulties  Cognition Arousal/Alertness: Awake/alert Behavior During Therapy: WFL for tasks assessed/performed Overall Cognitive Status: History of cognitive impairments - at baseline                                 General Comments: patient is able to follow single step commands with extra time, repetition, and multi-modal cues. she is oriented to person, hospital. she tells therapist she lives with her parented and that she is 81 years old. she is pleasant and cooperative        General Comments General comments (skin integrity, edema, etc.): patient requesting to sit up in the chair at end of session.    Exercises     Assessment/Plan    PT Assessment Patient needs continued PT services  PT Problem List Decreased strength;Decreased activity tolerance;Decreased balance;Decreased cognition;Decreased safety awareness       PT Treatment Interventions DME instruction;Gait training;Functional mobility training;Therapeutic activities;Therapeutic exercise;Balance training;Neuromuscular re-education;Cognitive remediation;Patient/family education    PT Goals (Current goals can be found in the Care Plan section)  Acute Rehab PT Goals Patient Stated Goal: none stated PT Goal Formulation: Patient unable to participate in goal setting Time For Goal Achievement: 06/18/22 Potential to Achieve Goals: Good    Frequency Min  2X/week     Co-evaluation PT/OT/SLP Co-Evaluation/Treatment: Yes Reason for Co-Treatment: Complexity of the patient's impairments (multi-system involvement);Necessary to address cognition/behavior during functional activity;For patient/therapist safety PT goals addressed during session: Mobility/safety with mobility OT goals addressed during session: ADL's and self-care       AM-PAC PT "6 Clicks" Mobility  Outcome Measure Help needed turning from your back to your side while in a flat bed without using bedrails?: A Lot Help needed moving from lying on your back to sitting on the side of a flat bed without using bedrails?: A Lot Help needed moving to and from a bed to a chair (including a wheelchair)?: A Lot Help needed standing up from a chair using your arms (e.g., wheelchair or bedside chair)?: A Lot Help needed to walk in hospital room?: A Lot Help needed climbing 3-5 steps with a railing? : Total 6 Click Score: 11    End of Session Equipment Utilized During Treatment: Gait belt Activity Tolerance: Patient tolerated treatment well Patient left: in chair;with call bell/phone within reach;with chair alarm set (OT present in the room)   PT Visit Diagnosis: Unsteadiness on feet (R26.81);Muscle weakness (generalized) (M62.81)    Time: 1191-4782 PT Time Calculation (min) (ACUTE ONLY): 36 min   Charges:   PT Evaluation $PT Eval Low Complexity: 1 Low PT Treatments $Therapeutic Activity: 8-22 mins  Donna Bernard, PT, MPT   Ina [employer] 06/04/2022, 11:55 AM

## 2022-06-04 NOTE — Evaluation (Signed)
Occupational Therapy Evaluation Patient Details Name: Monique Johnson MRN: [id number] DOB: 1941/09/04 Today's Date: 06/04/2022   History of Present Illness Pt is a 81 y.o. female who presented to emergency room with AMS with increased somnolence. PMH: essential hypertension, Alzheimer's dementia, gout and prediabetes.   Clinical Impression   Pt was seen for PT/OT co-evaluation this date. Prior to hospital admission, pt is poor historian, but per chart, pt lives at [facility]. Pt presents to acute OT demonstrating impaired ADL performance and functional mobility 2/2 decreased activity tolerance and functional strength/ROM/balance deficits.   Pt currently requires MIN A/HHA +2 with multimodal cueing for sequencing and safe hand placement for STS, BSC t/f, and ADL t/f.  MAX A for donning/doffing underpants - needs assistance threading underpants while seated and pulling into place in standing. MOD A with multimodal cueing for sequencing for donning/doffing hospital gown while seated - needs assistance with managing lines/leads and pulling into place. SUPERVISION/SETUP for feeding - needs assistance with cutting food, but able to open and manage small containers with multimodal cueing. Pt would benefit from skilled OT to address noted impairments and functional limitations (see below for any additional details). Pt would benefit from familiar environment, anticipate near baseline. Upon hospital discharge, recommend HHOT to maximize pt safety and return to PLOF.      Recommendations for follow up therapy are one component of a multi-disciplinary discharge planning process, led by the attending physician.  Recommendations may be updated based on patient status, additional functional criteria and insurance authorization.   Follow Up Recommendations  Home health OT ([facility] at [facility])    Assistance Recommended at Discharge Frequent or constant Supervision/Assistance  Patient can return  home with the following A little help with walking and/or transfers;A lot of help with bathing/dressing/bathroom;Assistance with cooking/housework;Direct supervision/assist for medications management;Assist for transportation    Functional Status Assessment  Patient has had a recent decline in their functional status and demonstrates the ability to make significant improvements in function in a reasonable and predictable amount of time.  Equipment Recommendations  Other (comment) (per next venue of care)    Recommendations for Other Services       Precautions / Restrictions Precautions Precautions: Fall Restrictions Weight Bearing Restrictions: No      Mobility Bed Mobility               General bed mobility comments: NT, at EOB upon arrival and seated in recliner at end of session    Transfers Overall transfer level: Needs assistance Equipment used: None, 2 person hand held assist Transfers: Sit to/from Stand, Bed to chair/wheelchair/BSC Sit to Stand: Min assist, +2 physical assistance     Step pivot transfers: Min assist, +2 physical assistance            Balance Overall balance assessment: Needs assistance Sitting-balance support: No upper extremity supported, Feet supported Sitting balance-Leahy Scale: Fair     Standing balance support: During functional activity, Single extremity supported Standing balance-Leahy Scale: Fair                             ADL either performed or assessed with clinical judgement   ADL Overall ADL's : Needs assistance/impaired                                       General ADL Comments:  MAX A for donning/doffing underpants - needs assistance threading underpants while seated and pulling into place in standing. MIN A/HHA +2 with multimodal cueing for sequencing and safe hand placement for ADL t/f. MOD A with multimodal cueing for sequencing for donning/doffing hospital gown while seated - needs  assistance with managing lines/leads and pulling into place. SUPERVISION/SETUP for feeding.      Pertinent Vitals/Pain Pain Assessment Pain Assessment: Faces Faces Pain Scale: No hurt     Hand Dominance     Extremity/Trunk Assessment Upper Extremity Assessment Upper Extremity Assessment: Generalized weakness   Lower Extremity Assessment Lower Extremity Assessment: Generalized weakness       Communication Communication Communication: No difficulties   Cognition Arousal/Alertness: Awake/alert Behavior During Therapy: WFL for tasks assessed/performed Overall Cognitive Status: History of cognitive impairments - at baseline                                 General Comments: Alzheimer's dementia     General Comments  patient requesting to sit up in the chair at end of session.            Home Living Family/patient expects to be discharged to:: Assisted living [facility])                                        Prior Functioning/Environment Prior Level of Function : Patient poor historian/Family not available             Mobility Comments: patient reports she has no difficulty with ambulation at baseline however patient is a poor historian with history of cognitive impairments          OT Problem List: Decreased strength;Decreased range of motion;Decreased activity tolerance;Impaired balance (sitting and/or standing)      OT Treatment/Interventions: Self-care/ADL training;Therapeutic exercise;Energy conservation;DME and/or AE instruction;Therapeutic activities;Patient/family education;Balance training    OT Goals(Current goals can be found in the care plan section) Acute Rehab OT Goals Patient Stated Goal: To go home OT Goal Formulation: With patient Time For Goal Achievement: 06/18/22 Potential to Achieve Goals: Good  OT Frequency: Min 2X/week    Co-evaluation PT/OT/SLP Co-Evaluation/Treatment: Yes Reason for  Co-Treatment: Complexity of the patient's impairments (multi-system involvement);Necessary to address cognition/behavior during functional activity;For patient/therapist safety PT goals addressed during session: Mobility/safety with mobility OT goals addressed during session: ADL's and self-care      AM-PAC OT "6 Clicks" Daily Activity     Outcome Measure Help from another person eating meals?: A Little Help from another person taking care of personal grooming?: A Little Help from another person toileting, which includes using toliet, bedpan, or urinal?: A Lot Help from another person bathing (including washing, rinsing, drying)?: A Lot Help from another person to put on and taking off regular upper body clothing?: A Little Help from another person to put on and taking off regular lower body clothing?: A Lot 6 Click Score: 15   End of Session Equipment Utilized During Treatment: Gait belt Nurse Communication: Mobility status;Other (comment) (applied lap belt and bleeding around IV site)  Activity Tolerance: Patient tolerated treatment well Patient left: in chair;with call bell/phone within reach;with chair alarm set;with restraints reapplied (lap belt applied)  OT Visit Diagnosis: Unsteadiness on feet (R26.81)                Time: 7782-4235 OT  Time Calculation (min): 32 min Charges:  OT General Charges $OT Visit: 1 Visit OT Evaluation $OT Eval Moderate Complexity: 1 Mod OT Treatments $Self Care/Home Management : 8-22 mins  [employer], OTDS  [employer] 06/04/2022, 12:09 PM

## 2022-06-06 ENCOUNTER — Emergency Department: Payer: Medicare Other

## 2022-06-06 ENCOUNTER — Inpatient Hospital Stay: Payer: Medicare Other

## 2022-06-06 ENCOUNTER — Inpatient Hospital Stay
Admission: EM | Admit: 2022-06-06 | Discharge: 2022-06-10 | DRG: 177 | Disposition: A | Discharge status: Home Health | Payer: Medicare Other | Attending: Internal Medicine | Admitting: Internal Medicine

## 2022-06-06 DIAGNOSIS — R4182 Altered mental status, unspecified: Principal | ICD-10-CM

## 2022-06-06 DIAGNOSIS — G9341 Metabolic encephalopathy: Secondary | ICD-10-CM | POA: Diagnosis present

## 2022-06-06 DIAGNOSIS — I10 Essential (primary) hypertension: Secondary | ICD-10-CM | POA: Diagnosis not present

## 2022-06-06 DIAGNOSIS — J189 Pneumonia, unspecified organism: Secondary | ICD-10-CM

## 2022-06-06 DIAGNOSIS — F02818 Dementia in other diseases classified elsewhere, unspecified severity, with other behavioral disturbance: Secondary | ICD-10-CM | POA: Diagnosis present

## 2022-06-06 DIAGNOSIS — J690 Pneumonitis due to inhalation of food and vomit: Secondary | ICD-10-CM | POA: Diagnosis present

## 2022-06-06 DIAGNOSIS — Z803 Family history of malignant neoplasm of breast: Secondary | ICD-10-CM | POA: Diagnosis not present

## 2022-06-06 DIAGNOSIS — Z881 Allergy status to other antibiotic agents status: Secondary | ICD-10-CM

## 2022-06-06 DIAGNOSIS — Z79899 Other long term (current) drug therapy: Secondary | ICD-10-CM | POA: Diagnosis not present

## 2022-06-06 DIAGNOSIS — L89152 Pressure ulcer of sacral region, stage 2: Secondary | ICD-10-CM | POA: Diagnosis present

## 2022-06-06 DIAGNOSIS — F03918 Unspecified dementia, unspecified severity, with other behavioral disturbance: Secondary | ICD-10-CM | POA: Diagnosis not present

## 2022-06-06 DIAGNOSIS — I119 Hypertensive heart disease without heart failure: Secondary | ICD-10-CM | POA: Diagnosis present

## 2022-06-06 DIAGNOSIS — L8990 Pressure ulcer of unspecified site, unspecified stage: Secondary | ICD-10-CM | POA: Insufficient documentation

## 2022-06-06 LAB — BLOOD GAS, VENOUS
Acid-Base Excess: 0.4 mmol/L (ref 0.0–2.0)
Bicarbonate: 24.6 mmol/L (ref 20.0–28.0)
O2 Saturation: 85.2 %
Patient temperature: 37
pCO2, Ven: 37 mmHg — ABNORMAL LOW (ref 44–60)
pH, Ven: 7.43 (ref 7.25–7.43)
pO2, Ven: 51 mmHg — ABNORMAL HIGH (ref 32–45)

## 2022-06-06 LAB — COMPREHENSIVE METABOLIC PANEL
ALT: 26 U/L (ref 0–44)
AST: 32 U/L (ref 15–41)
Albumin: 2.5 g/dL — ABNORMAL LOW (ref 3.5–5.0)
Alkaline Phosphatase: 64 U/L (ref 38–126)
Anion gap: 6 (ref 5–15)
BUN: 27 mg/dL — ABNORMAL HIGH (ref 8–23)
CO2: 23 mmol/L (ref 22–32)
Calcium: 9.1 mg/dL (ref 8.9–10.3)
Chloride: 112 mmol/L — ABNORMAL HIGH (ref 98–111)
Creatinine, Ser: 1.14 mg/dL — ABNORMAL HIGH (ref 0.44–1.00)
GFR, Estimated: 49 mL/min — ABNORMAL LOW (ref >60)
Glucose, Bld: 113 mg/dL — ABNORMAL HIGH (ref 70–99)
Potassium: 3.9 mmol/L (ref 3.5–5.1)
Sodium: 141 mmol/L (ref 135–145)
Total Bilirubin: 0.5 mg/dL (ref 0.3–1.2)
Total Protein: 5.8 g/dL — ABNORMAL LOW (ref 6.5–8.1)

## 2022-06-06 LAB — CBC
HCT: 34 % — ABNORMAL LOW (ref 36.0–46.0)
Hemoglobin: 10.9 g/dL — ABNORMAL LOW (ref 12.0–15.0)
MCH: 30.1 pg (ref 26.0–34.0)
MCHC: 32.1 g/dL (ref 30.0–36.0)
MCV: 93.9 fL (ref 80.0–100.0)
Platelets: 238 10*3/uL (ref 150–400)
RBC: 3.62 MIL/uL — ABNORMAL LOW (ref 3.87–5.11)
RDW: 12.8 % (ref 11.5–15.5)
WBC: 13.6 10*3/uL — ABNORMAL HIGH (ref 4.0–10.5)
nRBC: 0 % (ref 0.0–0.2)

## 2022-06-06 LAB — CULTURE, BLOOD (ROUTINE X 2)
Culture: NO GROWTH
Culture: NO GROWTH

## 2022-06-06 MED ORDER — ONDANSETRON HCL 4 MG/2ML IJ SOLN: 4.0000 mg | Freq: Four times a day (QID) | INTRAMUSCULAR | Status: DC | PRN

## 2022-06-06 MED ORDER — ACETAMINOPHEN 650 MG RE SUPP: 650.0000 mg | Freq: Four times a day (QID) | RECTAL | Status: DC | PRN

## 2022-06-06 MED ORDER — ENOXAPARIN SODIUM 40 MG/0.4ML IJ SOSY
40.0000 mg | PREFILLED_SYRINGE | Freq: Every day | INTRAMUSCULAR | Status: DC
  Administered 2022-06-06 – 2022-06-10 (×5): 40 mg via SUBCUTANEOUS
  Filled 2022-06-06 (×5): qty 0.4

## 2022-06-06 MED ORDER — ALLOPURINOL 100 MG PO TABS
100.0000 mg | ORAL_TABLET | Freq: Every day | ORAL | Status: DC
  Administered 2022-06-07 – 2022-06-10 (×4): 100 mg via ORAL
  Filled 2022-06-06 (×4): qty 1

## 2022-06-06 MED ORDER — PSYLLIUM 95 % PO PACK
1.0000 | PACK | Freq: Two times a day (BID) | ORAL | Status: DC
  Administered 2022-06-06 – 2022-06-10 (×6): 1 via ORAL
  Filled 2022-06-06 (×8): qty 1

## 2022-06-06 MED ORDER — SODIUM CHLORIDE 0.9% FLUSH: 3.0000 mL | INTRAVENOUS | Status: DC | PRN

## 2022-06-06 MED ORDER — SODIUM CHLORIDE 0.9 % IV SOLN
3.0000 g | Freq: Four times a day (QID) | INTRAVENOUS | Status: DC
  Administered 2022-06-07 – 2022-06-10 (×13): 3 g via INTRAVENOUS
  Filled 2022-06-06: qty 8
  Filled 2022-06-06: qty 3
  Filled 2022-06-06 (×2): qty 8
  Filled 2022-06-06: qty 3
  Filled 2022-06-06: qty 8
  Filled 2022-06-06 (×2): qty 3
  Filled 2022-06-06 (×7): qty 8

## 2022-06-06 MED ORDER — ONDANSETRON HCL 4 MG PO TABS: 4.0000 mg | ORAL_TABLET | Freq: Four times a day (QID) | ORAL | Status: DC | PRN

## 2022-06-06 MED ORDER — SODIUM CHLORIDE 0.9 % IV SOLN: 250.0000 mL | INTRAVENOUS | Status: DC | PRN

## 2022-06-06 MED ORDER — SODIUM CHLORIDE 0.9% FLUSH
3.0000 mL | Freq: Two times a day (BID) | INTRAVENOUS | Status: DC
  Administered 2022-06-06 – 2022-06-09 (×5): 3 mL via INTRAVENOUS

## 2022-06-06 MED ORDER — LORAZEPAM 0.5 MG PO TABS
0.5000 mg | ORAL_TABLET | Freq: Two times a day (BID) | ORAL | Status: DC | PRN
  Administered 2022-06-06 – 2022-06-09 (×3): 0.5 mg via ORAL
  Filled 2022-06-06 (×3): qty 1

## 2022-06-06 MED ORDER — PSYLLIUM 28 % PO PACK: 1.0000 | PACK | Freq: Two times a day (BID) | ORAL | Status: DC

## 2022-06-06 MED ORDER — MEMANTINE HCL 5 MG PO TABS
5.0000 mg | ORAL_TABLET | Freq: Two times a day (BID) | ORAL | Status: DC
  Administered 2022-06-06 – 2022-06-10 (×8): 5 mg via ORAL
  Filled 2022-06-06 (×8): qty 1

## 2022-06-06 MED ORDER — ACETAMINOPHEN 325 MG PO TABS
650.0000 mg | ORAL_TABLET | Freq: Four times a day (QID) | ORAL | Status: DC | PRN
  Administered 2022-06-06: 650 mg via ORAL
  Filled 2022-06-06: qty 2

## 2022-06-06 MED ORDER — SODIUM CHLORIDE 0.9 % IV SOLN
1.0000 g | Freq: Once | INTRAVENOUS | Status: AC
  Administered 2022-06-06: 1 g via INTRAVENOUS
  Filled 2022-06-06: qty 10

## 2022-06-06 MED ORDER — SENNOSIDES-DOCUSATE SODIUM 8.6-50 MG PO TABS
1.0000 | ORAL_TABLET | Freq: Every day | ORAL | Status: DC
Start: 2022-06-06 — End: 2022-06-10
  Administered 2022-06-06 – 2022-06-09 (×4): 1 via ORAL
  Filled 2022-06-06 (×4): qty 1

## 2022-06-06 MED ORDER — QUETIAPINE FUMARATE 25 MG PO TABS
25.0000 mg | ORAL_TABLET | Freq: Every day | ORAL | Status: DC
  Administered 2022-06-06 – 2022-06-09 (×4): 25 mg via ORAL
  Filled 2022-06-06 (×4): qty 1

## 2022-06-06 MED ORDER — SODIUM CHLORIDE 0.9 % IV SOLN
500.0000 mg | Freq: Once | INTRAVENOUS | Status: AC
  Administered 2022-06-06: 500 mg via INTRAVENOUS
  Filled 2022-06-06: qty 5

## 2022-06-06 NOTE — ED Notes (Signed)
Sister at bedside and updated on POC.

## 2022-06-06 NOTE — ED Triage Notes (Addendum)
Pt presents to ED via EMS from [facility] with altered mental status. Pt has hx of dementia and staff nurse from facility had reported pt seemed alert initially but after she left to get her medications and came back and pt was unresponsive. Nurse reports she has never taken care of pt prior to today. Pt does open eyes when her name is called but is not answering questions at this time. FSBS with EMS 124 BP 140/70 O2 91% on room air and 96% on 2L HR 62

## 2022-06-06 NOTE — H&P (Signed)
History and Physical    Patient: Monique Johnson AYT:[id number] DOB: 11-26-1941 DOA: 06/06/2022 DOS: the patient was seen and examined on 06/06/2022 PCP: System, Provider Not In  Patient coming from: Assisted living facility  Chief Complaint:  Chief Complaint  Patient presents with   Altered Mental Status   Most of the history is obtained from the EMR as patient is unable to provide any history. HPI: Monique Johnson is a 81 y.o. female with medical history significant for hypertension, Alzheimer's dementia, gout and prediabetes who was recently discharged from the hospital on 06/04/22 after treatment for sepsis from multifocal pneumonia. She was brought back to the ER for evaluation of mental status changes.  Per the staff at the assisted living facility, patient was initially alert but when the nurse returned with her medications she found patient unresponsive. EMS was called and patient had room air pulse oximetry of 91% which improved to 96% on 2 L.  She was afebrile and had a pulse rate of 62. During my evaluation patient is lethargic but arouses to verbal stimuli.  She has very dry mucous membranes and is able to tell me that she feels terrible.  She is oriented to person not to place or time. Labs show leukocytosis with a white count of 13.6 and imaging shows small bilateral pleural effusions with significant bibasilar atelectasis.  Airspace consolidation in right upper lobe consistent with pneumonia, aspiration is not entirely excluded. She received a dose of IV Rocephin and Zithromax in the ER and will be admitted to the hospital for further evaluation    Review of Systems: As mentioned in the history of present illness. All other systems reviewed and are negative. No past medical history on file. No past surgical history on file. Social History:  has no history on file for tobacco use, alcohol use, and drug use.  Allergies  Allergen Reactions   Levofloxacin Other (See Comments)     RED AND HOT    Family History  Problem Relation Age of Onset   Breast cancer Mother     Prior to Admission medications   Medication Sig Start Date End Date Taking? Authorizing Provider  allopurinol (ZYLOPRIM) 100 MG tablet Take 100 mg by mouth daily. 05/07/22  Yes [provider]  amoxicillin-clavulanate (AUGMENTIN) 875-125 MG tablet Take 1 tablet by mouth 2 (two) times daily for 3 days. 06/04/22 06/07/22 Yes Lurene Shadow, MD  Calcium Carb-Cholecalciferol (CALCIUM CARBONATE-VITAMIN D3 PO) Take 1 capsule by mouth daily.   Yes [provider]  LORazepam (ATIVAN) 0.5 MG tablet Take 0.5 mg by mouth 2 (two) times daily as needed. 05/17/22  Yes [provider]  losartan (COZAAR) 50 MG tablet Take 50 mg by mouth 2 (two) times daily. 05/26/22  Yes [provider]  memantine (NAMENDA) 5 MG tablet Take 5 mg by mouth 2 (two) times daily. 05/07/22  Yes [provider]  Multiple Vitamins-Iron (MULTIVITAMIN/IRON PO) Take 1 tablet by mouth daily.   Yes [provider]  psyllium (METAMUCIL SMOOTH TEXTURE) 28 % packet Take 1 packet by mouth 2 (two) times daily.   Yes [provider]  QUEtiapine (SEROQUEL) 25 MG tablet Take 25 mg by mouth at bedtime. 05/07/22  Yes [provider]  senna-docusate (SENOKOT-S) 8.6-50 MG tablet Take 1 tablet by mouth at bedtime.   Yes [provider]  Zinc 220 (50 Zn) MG CAPS Take 1 capsule by mouth daily.   Yes [provider]  QUEtiapine (SEROQUEL) 50 MG tablet Take  1 tablet by mouth at bedtime. Patient not taking: Reported on 06/06/2022 09/05/21   [provider]    Physical Exam: Vitals:   06/06/22 1100 06/06/22 1145 06/06/22 1200 06/06/22 1300  BP: (!) 129/55 136/64 (!) 122/57 116/61  Pulse: 62 61 (!) 55 60  Resp: 19 20 19 17   Temp:      TempSrc:      SpO2: 93% 99%  94%   Physical Exam Vitals and nursing note reviewed.  Constitutional:      Comments: Thin and frail Lethargic  but arouses to verbal stimuli  HENT:     Head: Normocephalic and atraumatic.     Nose: Nose normal.     Mouth/Throat:     Mouth: Mucous membranes are dry.  Eyes:     Pupils: Pupils are equal, round, and reactive to light.  Cardiovascular:     Rate and Rhythm: Normal rate and regular rhythm.  Pulmonary:     Breath sounds: Rhonchi and rales present.  Abdominal:     General: Abdomen is flat. Bowel sounds are normal.     Palpations: Abdomen is soft.  Musculoskeletal:        General: Normal range of motion.     Cervical back: Normal range of motion and neck supple.  Skin:    General: Skin is warm and dry.  Neurological:     Comments: Lethargic but arousable  Psychiatric:     Comments: Unable to assess     Data Reviewed: Relevant notes from primary care and specialist visits, past discharge summaries as available in EHR, including Care Everywhere. Prior diagnostic testing as pertinent to current admission diagnoses Updated medications and problem lists for reconciliation ED course, including vitals, labs, imaging, treatment and response to treatment Triage notes, nursing and pharmacy notes and ED provider's notes Notable results as noted in HPI Labs reviewed.  Sodium 141, potassium 3.9, chloride 112, bicarb 23, glucose 113, BUN 27, creatinine 1.14, calcium 9.1, total protein 5.8, albumin 2.5, AST 32, ALT 26, alkaline phosphatase 64, white count 13.6, hemoglobin 10.9, hematocrit 34.0, platelet count 238 CT scan of the head without contrast shows no evidence of acute intracranial abnormality. Mild chronic small vessel ischemic changes within the cerebral white matter. Chest x-ray reviewed by me shows persistent right perihilar and right upper lobe consolidations, concerning for multifocal infection. Recommend radiographic follow-up to ensure resolution. New left lower lung opacity which is likely due to atelectasis and small layering pleural effusion. CT scan of the chest without  contrast shows small BILATERAL pleural effusions with significant bibasilar atelectasis. Airspace consolidation in RIGHT upper lobe consistent with pneumonia, though aspiration is not entirely excluded. Radiographic follow-up until resolution recommended to exclude underlying pulmonary abnormalities including tumor. Scattered atelectasis in BILATERAL lower lobes and anterior segment RIGHT upper lobe. Scattered atherosclerotic calcifications including coronary arteries. Enlargement of cardiac chambers with trace pericardial effusion. Aortic Atherosclerosis  There are no new results to review at this time.  Assessment and Plan: * Pneumonia Patient with a history of Alzheimer's dementia who presents for evaluation of mental status changes and is found to have a right lower lobe infiltrate concerning for possible aspiration We will start patient on IV Unasyn Speech therapy for swallow function evaluation Keep patient n.p.o. 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  All questions and concerns have been addressed.  She verbalizes understanding and agrees with the plan.  CODE STATUS was discussed and she wishes for her sister to be a full code for now  Severity of Illness: The appropriate patient status for this patient is INPATIENT. Inpatient status is judged to be reasonable and necessary in order to provide the required intensity of service to ensure the patient's safety. The patient's presenting symptoms, physical exam findings, and initial radiographic and  laboratory data in the context of their chronic comorbidities is felt to place them at high risk for further clinical deterioration. Furthermore, it is not anticipated that the patient will be medically stable for discharge from the hospital within 2 midnights of admission.   * I certify that at the point of admission it is my clinical judgment that the patient will require inpatient hospital care spanning beyond 2 midnights from the point of admission due to high intensity of service, high risk for further deterioration and high frequency of surveillance required.*  Author: Lucile Shutters, MD 06/06/2022 1:33 PM  For on call review [email or web address].

## 2022-06-06 NOTE — Assessment & Plan Note (Signed)
History of dementia with behavioral disturbances Continue Namenda, Seroquel and lorazepam

## 2022-06-06 NOTE — Assessment & Plan Note (Signed)
Patient with a history of Alzheimer's dementia who presents for evaluation of mental status changes and is found to have a right lower lobe infiltrate concerning for possible aspiration We will start patient on IV Unasyn Speech therapy for swallow function evaluation Keep patient n.p.o. until seen and evaluated by speech

## 2022-06-06 NOTE — ED Notes (Signed)
ST at bedside assessing pt at this time.

## 2022-06-06 NOTE — ED Provider Notes (Signed)
[facility] Provider Note    Event Date/Time   First MD Initiated Contact with Patient 06/06/22 [phone]     (approximate)  History   Chief Complaint: Altered Mental Status  HPI  Monique Johnson is a 81 y.o. female with a past medical history of dementia who presents to the emergency department for altered mental status.  According to report patient is coming from [facility] nursing facility.  Patient has known dementia.  Nurse who is caring for the patient this morning states she gave the patient her normal morning medications however when she rechecked the patient she seemed to be altered and more somnolent, however the nurse per report states this was her first day caring for the patient and she does not know her baseline.  Here the patient is somnolent but awakens to voice.  She will answer some questions, for instance I asked how she is feeling she says "terrible."  She cannot provide any additional details about why she feels that way.  Has no specific complaints.  Physical Exam   Triage Vital Signs: ED Triage Vitals  Enc Vitals Group     BP 06/06/22 0700 135/68     Pulse Rate 06/06/22 0700 66     Resp 06/06/22 0700 19     Temp 06/06/22 0705 98.3 F (36.8 C)     Temp Source 06/06/22 0705 Oral     SpO2 06/06/22 0700 98 %     Weight --      Height --      Head Circumference --      Peak Flow --      Pain Score --      Pain Loc --      Pain Edu? --      Excl. in GC? --     Most recent vital signs: Vitals:   06/06/22 0700 06/06/22 0705  BP: 135/68 128/72  Pulse: 66 65  Resp: 19 16  Temp:  98.3 F (36.8 C)  SpO2: 98% 98%    General: Somnolent but awakens easily to voice answers questions.  Unable to provide additional history. CV:  Good peripheral perfusion.  Regular rate and rhythm  Resp:  Normal effort.  Equal breath sounds bilaterally.  Abd:  No distention.  Soft, nontender.  No rebound or guarding.   ED Results / Procedures /  Treatments   RADIOLOGY  I have viewed and interpreted the CT images of the head.  No acute abnormality seen on my evaluation. Radiology is read the CT scan is negative for acute abnormality. Chest x-ray shows persistent consolidations concerning for multifocal pneumonia as well as new left lower lobe opacity.   MEDICATIONS ORDERED IN ED: Medications - No data to display   IMPRESSION / MDM / ASSESSMENT AND PLAN / ED COURSE  I reviewed the triage vital signs and the nursing notes.  Patient's presentation is most consistent with acute presentation with potential threat to life or bodily function.  Patient presents to the emergency department for possible altered mental status.  Patient has known dementia but is not entirely clear the patient's baseline at this time.  During my examination no concerning findings.  Patient is somewhat somnolent but awakens easily to voice answers questions is not able to provide much history not able to provide any details about what happened this morning or why she is here.  Given the patient's possible altered mental status with unknown baseline we will check labs including a CBC,  chemistry a urinalysis we will also obtain a CT scan of the head.  Patient's lab work today shows overall reassuring chemistry mild renal insufficiency.  CBC shows white blood cell count somewhat elevated from 2 days ago when patient was discharged.  I reviewed the patient's chart including her discharge summary from 06/04/2022.  Patient was admitted to the hospital for multifocal pneumonia.  Patient completed Zithromax and Rocephin.  Patient was discharged 2 days ago.  Sister is now here with the patient.  I do not see any report of the patient being on oxygen at time of discharge.  We will see how the patient is satting on room air.  We will obtain a chest x-ray.  X-ray shows continued consolidations plus new left lower lobe consolidation.  Patient's white blood cell count is elevated  from 8000 to now 13,002 days ago.  Given the patient's altered mental status worsening chest x-ray and increasing leukocytosis we will admit to the hospital service for ongoing management.  I have restarted the patient on IV Rocephin and Zithromax.  FINAL CLINICAL IMPRESSION(S) / ED DIAGNOSES   Altered mental status Multifocal pneumonia   Note:  This document was prepared using Dragon voice recognition software and may include unintentional dictation errors.   Minna Antis, MD 06/06/22 1106

## 2022-06-06 NOTE — ED Notes (Signed)
Pt sister- POA at bedside. Pt is asleep, wakes to touch.

## 2022-06-06 NOTE — Evaluation (Signed)
Clinical/Bedside Swallow Evaluation Patient Details  Name: Monique Johnson MRN: [id number] Date of Birth: 01-Jun-1941  Today's Date: 06/06/2022 Time: SLP Start Time (ACUTE ONLY): 1320 SLP Stop Time (ACUTE ONLY): 1340 SLP Time Calculation (min) (ACUTE ONLY): 20 min  Past Medical History: No past medical history on file. Past Surgical History: No past surgical history on file. HPI:  Monique Johnson is a 81 y.o. female with medical history significant for hypertension, Alzheimer's dementia, gout and prediabetes who was recently discharged from the hospital on 06/04/22 after treatment for sepsis from multifocal pneumonia. She was brought back to the ER for evaluation of mental status changes.  Per the staff at the assisted living facility, patient was initially alert but when the nurse returned with her medications she found patient unresponsive. Chest x-ray revealed small bilateral pleural effusions with significant bibasilar atelectasis.  Airspace consolidation in right upper lobe consistent with pneumonia, aspiration is not entirely excluded.    Assessment / Plan / Recommendation  Clinical Impression  Pt was initially asleep but easier aroused with room lights on and sitting upright on stretcher. Pt presents with adequate oropharyngeal abilities when consuming ice chips, thin liquids via cups sips when self-feeding, puree and soft solids. At this time, recommend dysphagia 2 diet with thin liquids via cup sips and medicine whole with puree. No further skilled ST intervention is indicated at this time. SLP Visit Diagnosis: Dysphagia, unspecified (R13.10)    Aspiration Risk  Mild aspiration risk    Diet Recommendation Dysphagia 2 (Fine chop);Thin liquid   Liquid Administration via: Cup Medication Administration: Whole meds with puree Supervision: Staff to assist with self feeding;Full supervision/cueing for compensatory strategies Compensations: Minimize environmental distractions;Slow rate;Small  sips/bites Postural Changes: Seated upright at 90 degrees;Remain upright for at least 30 minutes after po intake    Other  Recommendations Oral Care Recommendations: Oral care BID    Recommendations for follow up therapy are one component of a multi-disciplinary discharge planning process, led by the attending physician.  Recommendations may be updated based on patient status, additional functional criteria and insurance authorization.  Follow up Recommendations No SLP follow up      Assistance Recommended at Discharge None  Functional Status Assessment Patient has had a recent decline in their functional status and/or demonstrates limited ability to make significant improvements in function in a reasonable and predictable amount of time    Swallow Study   General Date of Onset: 06/06/22 HPI: Miryah Ralls is a 81 y.o. female with medical history significant for hypertension, Alzheimer's dementia, gout and prediabetes who was recently discharged from the hospital on 06/04/22 after treatment for sepsis from multifocal pneumonia. She was brought back to the ER for evaluation of mental status changes.  Per the staff at the assisted living facility, patient was initially alert but when the nurse returned with her medications she found patient unresponsive. Chest x-ray revealed small bilateral pleural effusions with significant bibasilar atelectasis.  Airspace consolidation in right upper lobe consistent with pneumonia, aspiration is not entirely excluded. Type of Study: Bedside Swallow Evaluation Previous Swallow Assessment: none in chart Diet Prior to this Study: NPO Temperature Spikes Noted: No Respiratory Status: Nasal cannula (2L) History of Recent Intubation: No Behavior/Cognition: Alert;Cooperative;Pleasant mood Oral Cavity Assessment: Within Functional Limits Oral Care Completed by SLP: No Vision: Functional for self-feeding Self-Feeding Abilities: Needs assist;Needs set up Patient  Positioning: Upright in bed Baseline Vocal Quality: Normal Volitional Cough: Cognitively unable to elicit Volitional Swallow: Unable to elicit    Oral/Motor/Sensory Function  Overall Oral Motor/Sensory Function: Within functional limits   Ice Chips Ice chips: Within functional limits Presentation: Spoon   Thin Liquid Thin Liquid: Within functional limits Presentation: Cup;Self Fed    Nectar Thick Nectar Thick Liquid: Not tested   Honey Thick Honey Thick Liquid: Not tested   Puree Puree: Within functional limits Presentation: Spoon   Solid     Solid: Within functional limits Presentation: Spoon Other Comments: soft solid trials     Treyshaun Keatts B. Dreama Saa, M.S., CCC-SLP, Tree surgeon Certified Brain Injury Specialist [facility]  [facility] Services Office [phone] Ascom [phone] Fax [phone]

## 2022-06-06 NOTE — Assessment & Plan Note (Signed)
Patient is normotensive Hold losartan for now

## 2022-06-06 NOTE — ED Notes (Signed)
Pt remains on RA.  

## 2022-06-07 ENCOUNTER — Inpatient Hospital Stay
Admit: 2022-06-07 | Discharge: 2022-06-07 | Disposition: A | Discharge status: Home / Self Care | Payer: Medicare Other | Attending: Internal Medicine | Admitting: Internal Medicine

## 2022-06-07 DIAGNOSIS — J690 Pneumonitis due to inhalation of food and vomit: Secondary | ICD-10-CM | POA: Diagnosis not present

## 2022-06-07 DIAGNOSIS — I10 Essential (primary) hypertension: Secondary | ICD-10-CM | POA: Diagnosis not present

## 2022-06-07 DIAGNOSIS — F03918 Unspecified dementia, unspecified severity, with other behavioral disturbance: Secondary | ICD-10-CM | POA: Diagnosis not present

## 2022-06-07 LAB — ECHOCARDIOGRAM COMPLETE
AR max vel: 1.72 cm2
AV Peak grad: 8.2 mmHg
Ao pk vel: 1.43 m/s
Area-P 1/2: 2.35 cm2
Calc EF: 52.4 %
S' Lateral: 3.27 cm
Single Plane A2C EF: 53.8 %
Single Plane A4C EF: 51 %

## 2022-06-07 LAB — CBC
HCT: 30.6 % — ABNORMAL LOW (ref 36.0–46.0)
Hemoglobin: 9.7 g/dL — ABNORMAL LOW (ref 12.0–15.0)
MCH: 29.9 pg (ref 26.0–34.0)
MCHC: 31.7 g/dL (ref 30.0–36.0)
MCV: 94.4 fL (ref 80.0–100.0)
Platelets: 227 10*3/uL (ref 150–400)
RBC: 3.24 MIL/uL — ABNORMAL LOW (ref 3.87–5.11)
RDW: 12.8 % (ref 11.5–15.5)
WBC: 9 10*3/uL (ref 4.0–10.5)
nRBC: 0 % (ref 0.0–0.2)

## 2022-06-07 LAB — BASIC METABOLIC PANEL
Anion gap: 3 — ABNORMAL LOW (ref 5–15)
BUN: 23 mg/dL (ref 8–23)
CO2: 23 mmol/L (ref 22–32)
Calcium: 9 mg/dL (ref 8.9–10.3)
Chloride: 116 mmol/L — ABNORMAL HIGH (ref 98–111)
Creatinine, Ser: 0.89 mg/dL (ref 0.44–1.00)
GFR, Estimated: >60 mL/min (ref >60)
Glucose, Bld: 98 mg/dL (ref 70–99)
Potassium: 4.2 mmol/L (ref 3.5–5.1)
Sodium: 142 mmol/L (ref 135–145)

## 2022-06-07 MED ORDER — SODIUM CHLORIDE 0.9 % IV SOLN: INTRAVENOUS | Status: DC | PRN

## 2022-06-07 MED ORDER — ORAL CARE MOUTH RINSE: 15.0000 mL | OROMUCOSAL | Status: DC | PRN

## 2022-06-07 MED ORDER — ORAL CARE MOUTH RINSE
15.0000 mL | OROMUCOSAL | Status: DC
  Administered 2022-06-07 – 2022-06-09 (×11): 15 mL via OROMUCOSAL

## 2022-06-07 NOTE — Progress Notes (Signed)
*  PRELIMINARY RESULTS* Echocardiogram 2D Echocardiogram has been performed.  Monique Johnson 06/07/2022, 8:58 AM

## 2022-06-07 NOTE — Hospital Course (Signed)
Monique Johnson is a 81 y.o. female with medical history significant for hypertension, Alzheimer's dementia, gout and prediabetes who was recently discharged from the hospital on 06/04/22 after treatment for sepsis from multifocal pneumonia. She was brought back to the ER for evaluation of mental status changes.  CT scan showed right upper lobe pneumonia consistent with aspiration pneumonia.  Patient was started on Unasyn.

## 2022-06-07 NOTE — Progress Notes (Addendum)
  Progress Note   Patient: Monique Johnson RFX:[id number] DOB: 07/12/1941 DOA: 06/06/2022     1 DOS: the patient was seen and examined on 06/07/2022   Brief hospital course: Monique Johnson is a 81 y.o. female with medical history significant for hypertension, Alzheimer's dementia, gout and prediabetes who was recently discharged from the hospital on 06/04/22 after treatment for sepsis from multifocal pneumonia. She was brought back to the ER for evaluation of mental status changes.  CT scan showed right upper lobe pneumonia consistent with aspiration pneumonia.  Patient was started on Unasyn.  Assessment and Plan: Right upper lobe aspiration pneumonia. Acute metabolic encephalopathy secondary to pneumonia. Dementia with behavioral disturbance Reviewed patient's CT scan results, patient has right upper lobe pneumonia. Unasyn started, will continue. Patient also had a significant confusion, consistent with acute metabolic cephalopathy with baseline dementia. Continue Namenda and Seroquel. Patient has been seen by speech therapy, continue dysphagia 2 diet. Patient could not follow instructions for swallowing due to dementia.  Essential hypertension. Continue to follow.      Subjective:  Patient is confused, does not seem to have any short of breath.  Physical Exam: Vitals:   06/07/22 0004 06/07/22 0515 06/07/22 0750 06/07/22 1152  BP:  135/75 (!) 132/58 (!) 123/57  Pulse: (!) 59 61 (!) 53 62  Resp:  (!) 22 17 18   Temp:  98.1 F (36.7 C) 98.6 F (37 C) 97.8 F (36.6 C)  TempSrc:  Oral    SpO2: 94% 95% 96% 94%   General exam: Appears calm and comfortable  Respiratory system: Clear to auscultation. Respiratory effort normal. Cardiovascular system: S1 & S2 heard, RRR. No JVD, murmurs, rubs, gallops or clicks. No pedal edema. Gastrointestinal system: Abdomen is nondistended, soft and nontender. No organomegaly or masses felt. Normal bowel sounds heard. Central nervous system: Alert  and oriented x1. No focal neurological deficits. Extremities: Symmetric 5 x 5 power. Skin: No rashes, lesions or ulcers   Data Reviewed:  Reviewed chest CT scan, all lab results  Family Communication: not able to reach sister  Disposition: Status is: Inpatient Remains inpatient appropriate because: Severity of disease, IV antibiotics  Planned Discharge Destination: Skilled nursing facility    Time spent: 35 minutes  Author: , MD 06/07/2022 2:02 PM  For on call review [email or web address].

## 2022-06-08 DIAGNOSIS — J690 Pneumonitis due to inhalation of food and vomit: Secondary | ICD-10-CM | POA: Diagnosis not present

## 2022-06-08 DIAGNOSIS — F03918 Unspecified dementia, unspecified severity, with other behavioral disturbance: Secondary | ICD-10-CM | POA: Diagnosis not present

## 2022-06-08 DIAGNOSIS — G9341 Metabolic encephalopathy: Secondary | ICD-10-CM | POA: Diagnosis not present

## 2022-06-08 DIAGNOSIS — L8990 Pressure ulcer of unspecified site, unspecified stage: Secondary | ICD-10-CM | POA: Insufficient documentation

## 2022-06-08 NOTE — TOC Initial Note (Addendum)
Transition of Care [facility]) - Initial/Assessment Note    Patient Details  Name: Monique Johnson MRN: [id number] Date of Birth: 1941-11-22  [facility]) CM/SW Contact:    Liliana Cline, LCSW Phone Number: 06/08/2022, 11:03 AM  Clinical Narrative:                Patient is a resident at [facility].  Attempted call to sister Darel Hong, left VM. Called [facility] and spoke to [employer].  Corell confirmed patient resides in their Memory Care Unit.  Corell reported their Memory Care Resident Care Coordinator Ceivante will need to be contacted at [facility]'s main # on Monday (or when patient is close to medically ready) to come assess patient to ensure she can return to [facility].  Ceivante reported their preferred [facility] agency is [facility]. HH referral made to [facility].   11:08- Return call from sister Darel Hong. She is agreeable to plan to DC back to [facility] with [facility] services when medically ready.  Darel Hong asked about getting patient a RW and wheelchair. CSW called [facility] Corell back who confirmed patient does not have this DME and that the hospital can order it prior to DC.  Asked MD for orders for RW and wheelchair. Notified Elease Hashimoto with [employer] of DME needs prior to DC tomorrow. Elease Hashimoto reported one item will be delivered today and one tomorrow.  Per MD plan for DC tomorrow if [facility] assesses her.  Expected Discharge Plan: Home w Home Health Services Barriers to Discharge: Continued Medical Work up   Patient Goals and CMS Choice Patient states their goals for this hospitalization and ongoing recovery are:: ALF with [facility] CMS Medicare.gov Compare Post Acute Care list provided to:: Patient Represenative (must comment)    Expected Discharge Plan and Services Expected Discharge Plan: Home w Home Health Services       Living arrangements for the past 2 months: Assisted Living Facility                           HH Arranged: PT, OT,  RN [facility] Agency: [facility] Date [facility] Agency Contacted: 06/08/22   Representative spoke with at [facility] Agency: Elnita Maxwell  Prior Living Arrangements/Services Living arrangements for the past 2 months: Assisted Living Facility Lives with:: Facility Resident Patient language and need for interpreter reviewed:: Yes Do you feel safe going back to the place where you live?: Yes      Need for Family Participation in Patient Care: Yes (Comment) Care giver support system in place?: Yes (comment)   Criminal Activity/Legal Involvement Pertinent to Current Situation/Hospitalization: No - Comment as needed  Activities of Daily Living Home Assistive Devices/Equipment: Environmental consultant (specify type) ADL Screening (condition at time of admission) Patient's cognitive ability adequate to safely complete daily activities?: No Is the patient deaf or have difficulty hearing?: No Does the patient have difficulty seeing, even when wearing glasses/contacts?: No Does the patient have difficulty concentrating, remembering, or making decisions?: Yes Patient able to express need for assistance with ADLs?: No Does the patient have difficulty dressing or bathing?: Yes Independently performs ADLs?: No Communication: Independent Dressing (OT): Needs assistance Is this a change from baseline?: Pre-admission baseline Grooming: Needs assistance Is this a change from baseline?: Pre-admission baseline Feeding: Needs assistance Is this a change from baseline?: Pre-admission baseline Bathing: Needs assistance Is this a change from baseline?: Pre-admission baseline Toileting: Needs assistance Is this a change from baseline?:  Pre-admission baseline In/Out Bed: Needs assistance Is this a change from baseline?: Pre-admission baseline Walks in Home: Needs assistance Is this a change from baseline?: Pre-admission baseline Does the patient have difficulty walking or climbing stairs?: Yes Weakness of Legs: Both Weakness  of Arms/Hands: Both  Permission Sought/Granted Permission sought to share information with : Magazine features editor                Emotional Assessment         Alcohol / Substance Use: Not Applicable Psych Involvement: No (comment)  Admission diagnosis:  Pneumonia [J18.9] Altered mental status, unspecified altered mental status type [R41.82] Multifocal pneumonia [J18.9] Patient Active Problem List   Diagnosis Date Noted   Pressure injury of skin 06/08/2022   Pneumonia 06/06/2022   Essential hypertension 06/02/2022   AKI (acute kidney injury) (HCC) 06/02/2022   Dementia with behavioral disturbance (HCC) 06/02/2022   Gout 06/02/2022   Multifocal pneumonia 06/02/2022   Acute metabolic encephalopathy 06/02/2022   Hypotension 06/02/2022   Severe sepsis (HCC) 06/01/2022   PCP:  System, Provider Not In Pharmacy:   [facility] - [place], [place] - [address] [place] [address] Phone: [phone] Fax: [phone]  [facility] - [place], [place] - [address] [place] [address] Phone: [phone] Fax: [phone]     Social Determinants of Health (SDOH) Interventions    Readmission Risk Interventions    06/08/2022   11:01 AM  Readmission Risk Prevention Plan  Post Dischage Appt Complete  Medication Screening Complete  Transportation Screening Complete

## 2022-06-08 NOTE — Evaluation (Signed)
Occupational Therapy Evaluation Patient Details Name: Monique Johnson MRN: [id number] DOB: 17-Mar-1941 Today's Date: 06/08/2022   History of Present Illness Pt is a 81 yo female that presented to ED for AMS, CT showed aspiration PNA. Recently discharged from the hospital 06/04/2022 after sepsis 2/2 mulitfocal PNA. PMH of alzheimers, HTN, gout, prediabetes.   Clinical Impression   [name] presents with generalized weakness, impaired balance, limited endurance, and confusion. Prior to admission, she has been living at [facility]. Pt is unable to provide information re: her PLOF. During today's evaluation, she is oriented to self only. 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 Pt able to follow basic directions after multiple repetitions and ongoing cueing. She required Min A for supine<>sit transfers and CGA for sit<>stand, with hand-held assist. Pt received in room with breakfast tray in front of her, able to feed self, although she had taken her container of chocolate pudding and thoroughly mixed it with the breakfast foods on her plate. Pt perseverated on heart monitor throughout session, repeatedly looking at and moving it, asking what she was supposed to do with it. Pt has some fxl mobility needs but appears to be most impaired in cognition. Will continue to offer OT services while pt is hospitalized, with return to memory care facility recommended post-DC. Unable to determine definitively, given pt's inability to provide information about her PLOF, but anticipate that she is near her baseline in terms of fxl mobility.   Recommendations for follow up therapy are one component of a multi-disciplinary discharge planning process, led by the attending physician.  Recommendations may be updated based on patient status, additional functional criteria and insurance authorization.   Follow Up Recommendations       Assistance Recommended  at Discharge Frequent or constant Supervision/Assistance  Patient can return home with the following A little help with walking and/or transfers;A lot of help with bathing/dressing/bathroom;Assistance with cooking/housework;Direct supervision/assist for medications management;Assist for transportation;Direct supervision/assist for financial management    Functional Status Assessment  Patient has had a recent decline in their functional status and demonstrates the ability to make significant improvements in function in a reasonable and predictable amount of time.  Equipment Recommendations  None recommended by OT    Recommendations for Other Services       Precautions / Restrictions Precautions Precautions: Fall Restrictions Weight Bearing Restrictions: No      Mobility Bed Mobility Overal bed mobility: Needs Assistance Bed Mobility: Supine to Sit, Sit to Supine     Supine to sit: Min assist Sit to supine: Min assist   General bed mobility comments: extended time, ongoing cueing and encouragement to continue    Transfers Overall transfer level: Needs assistance Equipment used: 2 person hand held assist Transfers: Sit to/from Stand Sit to Stand: Min guard           General transfer comment: improved speed and steadiness with multiple sit<>stand trials      Balance Overall balance assessment: Needs assistance Sitting-balance support: Feet supported, Bilateral upper extremity supported Sitting balance-Leahy Scale: Good     Standing balance support: Bilateral upper extremity supported Standing balance-Leahy Scale: Fair                             ADL either performed or assessed with clinical judgement   ADL Overall ADL's : Needs assistance/impaired Eating/Feeding: Set up;Supervision/  safety                                           Vision         Perception     Praxis      Pertinent Vitals/Pain Pain Assessment Pain  Assessment: No/denies pain     Hand Dominance     Extremity/Trunk Assessment Upper Extremity Assessment Upper Extremity Assessment: Generalized weakness   Lower Extremity Assessment Lower Extremity Assessment: Generalized weakness   Cervical / Trunk Assessment Cervical / Trunk Assessment: Normal   Communication Communication Communication: Receptive difficulties   Cognition Arousal/Alertness: Awake/alert Behavior During Therapy: WFL for tasks assessed/performed Overall Cognitive Status: History of cognitive impairments - at baseline                                 General Comments: Pt is oriented to self only. Is able to answer basic questions (e.g., "how are you feeling?") and follow simple directions only after they have been repeated several times.     General Comments       Exercises Other Exercises Other Exercises: cognitive re-orientation; safety educ   Shoulder Instructions      Home Living Family/patient expects to be discharged to:: Other (Comment) (Memory care facility)                                        Prior Functioning/Environment Prior Level of Function : Patient poor historian/Family not available             Mobility Comments: Pt unable to provide info re: PLOF ADLs Comments: Pt has been living at [facility], in their memory care unit        OT Problem List: Decreased strength;Decreased range of motion;Decreased activity tolerance;Impaired balance (sitting and/or standing);Decreased coordination;Decreased cognition      OT Treatment/Interventions: Self-care/ADL training;Therapeutic exercise;DME and/or AE instruction;Therapeutic activities;Patient/family education;Balance training    OT Goals(Current goals can be found in the care plan section) Acute Rehab OT Goals Patient Stated Goal: unable to state a goal OT Goal Formulation: With patient Time For Goal Achievement: 06/22/22 Potential to Achieve Goals:  Good ADL Goals Pt Will Perform Grooming: sitting;with set-up;with min guard assist Pt Will Perform Upper Body Bathing: with min assist;sitting;with set-up Pt Will Transfer to Toilet: with min assist;regular height toilet;stand pivot transfer (using LRAD)  OT Frequency: Min 2X/week    Co-evaluation              AM-PAC OT "6 Clicks" Daily Activity     Outcome Measure Help from another person eating meals?: A Little Help from another person taking care of personal grooming?: A Little Help from another person toileting, which includes using toliet, bedpan, or urinal?: A Lot Help from another person bathing (including washing, rinsing, drying)?: A Lot Help from another person to put on and taking off regular upper body clothing?: A Little Help from another person to put on and taking off regular lower body clothing?: A Lot 6 Click Score: 15   End of Session Nurse Communication: Other (comment) (Observation with feeding)  Activity Tolerance: Patient tolerated treatment well Patient left: in bed;with call bell/phone within reach;with bed alarm set  OT Visit Diagnosis: Unsteadiness on  feet (R26.81);Muscle weakness (generalized) (M62.81);Cognitive communication deficit (R41.841)                Time: 3151-7616 OT Time Calculation (min): 14 min Charges:  OT General Charges $OT Visit: 1 Visit OT Evaluation $OT Eval Low Complexity: 1 Low OT Treatments $Self Care/Home Management : 8-22 mins Latina Craver, PhD, MS, OTR/L 06/08/22, 3:17 PM

## 2022-06-08 NOTE — Progress Notes (Signed)
Oral care performed. Pt tolerated well. 

## 2022-06-08 NOTE — Evaluation (Signed)
Physical Therapy Evaluation Patient Details Name: Teretha Chalupa MRN: [id number] DOB: February 18, 1941 Today's Date: 06/08/2022  History of Present Illness  Pt is a 81 yo female that presented to ED for AMS, CT showed aspiration PNA. Recently discharged from the hospital 06/04/2022 after sepsis 2/2 mulitfocal PNA. PMH of alzheimers, HTN, gout, prediabetes.   Clinical Impression  Pt alert, oriented to self only. 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 Unable to provide PLOF, but per chart pt is a from ALF.  The patient was able to follow all simple commands, occasional repetition needed. She was able to lift all extremities against gravity. Supine to sit with HOB elevated, supervision, and fair sitting balance noted. Sit <> Stand with RW and CGA, pt noted to have dirty linens due to BM and urine. maxA for pericare in standing. She was able to ambulate ~24ft but further ambulation deferred due to BM, 1 small LOB noted with retroambulation to step back towards EOB, minA to correct. Returned to supine and placed in chair position, minA.  Overall the patient demonstrated deficits (see "PT Problem List") that impede the patient's functional abilities, safety, and mobility and would benefit from skilled PT intervention. Recommendation at this time is to return to ALF with HHPT to maximize safety and function with 24/7 assistance including for mobility, ADLs, and meals. If ALF is unable to provide adequate assistance the patient would benefit from a transition to higher levels of care (LTC).      Recommendations for follow up therapy are one component of a multi-disciplinary discharge planning process, led by the attending physician.  Recommendations may be updated based on patient status, additional functional criteria and insurance authorization.  Follow Up Recommendations Home health PT Can patient physically be transported by private vehicle: Yes    Assistance  Recommended at Discharge Frequent or constant Supervision/Assistance  Patient can return home with the following  A little help with walking and/or transfers;A little help with bathing/dressing/bathroom;Direct supervision/assist for medications management;Direct supervision/assist for financial management;Assist for transportation;Assistance with cooking/housework;Assistance with feeding;Help with stairs or ramp for entrance    Equipment Recommendations Other (comment) (TBD)  Recommendations for Other Services       Functional Status Assessment Patient has had a recent decline in their functional status and demonstrates the ability to make significant improvements in function in a reasonable and predictable amount of time.     Precautions / Restrictions Precautions Precautions: Fall Restrictions Weight Bearing Restrictions: No      Mobility  Bed Mobility Overal bed mobility: Needs Assistance Bed Mobility: Supine to Sit, Sit to Supine     Supine to sit: Supervision, HOB elevated Sit to supine: Min assist   General bed mobility comments: minA to return to bed for BLE    Transfers Overall transfer level: Needs assistance Equipment used: Rolling walker (2 wheels) Transfers: Sit to/from Stand Sit to Stand: Min guard                Ambulation/Gait Ambulation/Gait assistance: Min assist Gait Distance (Feet): 5 Feet           General Gait Details: noted incontinence, true ambulation deferred. Pt did have one small LOB when stepping back to bed minA to correct  Stairs            Wheelchair Mobility    Modified Rankin (Stroke Patients Only)       Balance Overall balance assessment: Needs assistance Sitting-balance support: No upper  extremity supported, Feet supported Sitting balance-Leahy Scale: Fair       Standing balance-Leahy Scale: Fair                               Pertinent Vitals/Pain Pain Assessment Pain Assessment:  Faces Faces Pain Scale: No hurt    Home Living                          Prior Function Prior Level of Function : Patient poor historian/Family not available             Mobility Comments: per chart pt from ALF. unable to provide PLOF information       Hand Dominance        Extremity/Trunk Assessment   Upper Extremity Assessment Upper Extremity Assessment: Generalized weakness;Difficult to assess due to impaired cognition    Lower Extremity Assessment Lower Extremity Assessment: Generalized weakness;Difficult to assess due to impaired cognition    Cervical / Trunk Assessment Cervical / Trunk Assessment: Normal  Communication   Communication: No difficulties  Cognition Arousal/Alertness: Awake/alert Behavior During Therapy: WFL for tasks assessed/performed Overall Cognitive Status: History of cognitive impairments - at baseline                                 General Comments: pt oriented to self, able to follow all commands        General Comments      Exercises     Assessment/Plan    PT Assessment Patient needs continued PT services  PT Problem List Decreased strength;Decreased activity tolerance;Decreased balance;Decreased cognition;Decreased safety awareness       PT Treatment Interventions DME instruction;Gait training;Functional mobility training;Therapeutic activities;Therapeutic exercise;Balance training;Neuromuscular re-education;Cognitive remediation;Patient/family education    PT Goals (Current goals can be found in the Care Plan section)  Acute Rehab PT Goals PT Goal Formulation: Patient unable to participate in goal setting Time For Goal Achievement: 06/22/22 Potential to Achieve Goals: Good    Frequency Min 2X/week     Co-evaluation               AM-PAC PT "6 Clicks" Mobility  Outcome Measure Help needed turning from your back to your side while in a flat bed without using bedrails?: A Little Help  needed moving from lying on your back to sitting on the side of a flat bed without using bedrails?: A Little Help needed moving to and from a bed to a chair (including a wheelchair)?: A Little Help needed standing up from a chair using your arms (e.g., wheelchair or bedside chair)?: A Little Help needed to walk in hospital room?: A Little Help needed climbing 3-5 steps with a railing? : A Little 6 Click Score: 18    End of Session Equipment Utilized During Treatment: Gait belt Activity Tolerance: Patient tolerated treatment well Patient left: in bed;with call bell/phone within reach;with bed alarm set Nurse Communication: Mobility status PT Visit Diagnosis: Unsteadiness on feet (R26.81);Muscle weakness (generalized) (M62.81)    Time: 3007-6226 PT Time Calculation (min) (ACUTE ONLY): 20 min   Charges:   PT Evaluation $PT Eval Low Complexity: 1 Low PT Treatments $Therapeutic Activity: 8-22 mins        Olga Coaster PT, DPT 9:06 AM,06/08/22

## 2022-06-08 NOTE — Progress Notes (Signed)
  Progress Note   Patient: Monique Johnson FUX:[id number] DOB: August 19, 1941 DOA: 06/06/2022     2 DOS: the patient was seen and examined on 06/08/2022   Brief hospital course: Alleyah Twombly is a 81 y.o. female with medical history significant for hypertension, Alzheimer's dementia, gout and prediabetes who was recently discharged from the hospital on 06/04/22 after treatment for sepsis from multifocal pneumonia. She was brought back to the ER for evaluation of mental status changes.  CT scan showed right upper lobe pneumonia consistent with aspiration pneumonia.  Patient was started on Unasyn.  Assessment and Plan: Right upper lobe aspiration pneumonia. Acute metabolic encephalopathy secondary to pneumonia. Dementia with behavioral disturbance Patient condition had improved, no agitation.  Still confused as at baseline. Continue antibiotics with Unasyn, may consider transition to oral antibiotics with Augmentin tomorrow. Patient has been seen by speech therapy, on dysphagia 2 diet.  However, patient will not be able to follow speech therapy recommendation due to dementia.  She will be at risk of recurrent aspiration pneumonia.  Essential hypertension. Continue to follow.      Subjective:  Patient seem to be back to baseline, assisted-living facility will screen her tomorrow to see if she can go back.  Physical Exam: Vitals:   06/07/22 2327 06/08/22 0445 06/08/22 0803 06/08/22 1306  BP: (!) 118/56 (!) 125/57 118/63 118/62  Pulse: 62 (!) 57 (!) 57 69  Resp: 16 16 18 19   Temp: 98.5 F (36.9 C) 98.6 F (37 C)    TempSrc:  Oral    SpO2: 96% 95% 92% 94%   General exam: Appears calm and comfortable  Respiratory system: Clear to auscultation. Respiratory effort normal. Cardiovascular system: S1 & S2 heard, RRR. No JVD, murmurs, rubs, gallops or clicks. No pedal edema. Gastrointestinal system: Abdomen is nondistended, soft and nontender. No organomegaly or masses felt. Normal bowel sounds  heard. Central nervous system: Alert and oriented x1. No focal neurological deficits. Extremities: Symmetric 5 x 5 power. Skin: No rashes, lesions or ulcers   Data Reviewed:  No new lab results.  Family Communication:   Disposition: Status is: Inpatient Remains inpatient appropriate because: Unsafe discharge  Planned Discharge Destination:  ALF    Time spent: 35 minutes  Author: , MD 06/08/2022 2:24 PM  For on call review [email or web address].

## 2022-06-09 ENCOUNTER — Encounter: Payer: Self-pay | Admitting: Internal Medicine

## 2022-06-09 ENCOUNTER — Other Ambulatory Visit: Payer: Self-pay

## 2022-06-09 DIAGNOSIS — F03918 Unspecified dementia, unspecified severity, with other behavioral disturbance: Secondary | ICD-10-CM | POA: Diagnosis not present

## 2022-06-09 DIAGNOSIS — G9341 Metabolic encephalopathy: Secondary | ICD-10-CM | POA: Diagnosis not present

## 2022-06-09 DIAGNOSIS — J690 Pneumonitis due to inhalation of food and vomit: Secondary | ICD-10-CM | POA: Diagnosis not present

## 2022-06-09 MED ORDER — AMOXICILLIN-POT CLAVULANATE 875-125 MG PO TABS: 1.0000 | ORAL_TABLET | Freq: Two times a day (BID) | ORAL | 0 refills | Status: AC

## 2022-06-09 NOTE — NC FL2 (Signed)
[place] MEDICAID FL2 LEVEL OF CARE SCREENING TOOL     IDENTIFICATION  Patient Name: Monique Johnson Birthdate: 1941-07-17 Sex: female Admission Date (Current Location): 06/06/2022  [facility] and [place] Number:  Chiropodist and Address:  [facility], [address] [place], [place] [address]      Provider Number: [id number]  Attending Physician Name and Address:  Marrion Coy, MD  Relative Name and Phone Number:       Current Level of Care: Hospital Recommended Level of Care: Assisted Living Facility, Memory Care (with PT, OT, RN through [facility]) Prior Approval Number:    Date Approved/Denied:   PASRR Number:    Discharge Plan: Other (Comment) [facility] Care with PT, OT, RN through [facility])    Current Diagnoses: Patient Active Problem List   Diagnosis Date Noted   Pressure injury of skin 06/08/2022   Pneumonia 06/06/2022   Essential hypertension 06/02/2022   AKI (acute kidney injury) (HCC) 06/02/2022   Dementia with behavioral disturbance (HCC) 06/02/2022   Gout 06/02/2022   Multifocal pneumonia 06/02/2022   Acute metabolic encephalopathy 06/02/2022   Hypotension 06/02/2022   Severe sepsis (HCC) 06/01/2022    Orientation RESPIRATION BLADDER Height & Weight     Self  Normal Incontinent Weight:   Height:     BEHAVIORAL SYMPTOMS/MOOD NEUROLOGICAL BOWEL NUTRITION STATUS   (None)  (Dementia) Incontinent Diet (DYS 2)  AMBULATORY STATUS COMMUNICATION OF NEEDS Skin   Limited Assist Verbally Bruising, PU Stage and Appropriate Care (Erythema/redness.)   PU Stage 2 Dressing:  (Medical coccyx: Foam.)                   Personal Care Assistance Level of Assistance  Bathing, Feeding, Dressing Bathing Assistance: Limited assistance Feeding assistance: Limited assistance Dressing Assistance: Limited assistance     Functional Limitations Info  Sight, Hearing, Speech Sight Info: Adequate Hearing  Info: Adequate Speech Info: Adequate    SPECIAL CARE FACTORS FREQUENCY  PT (By licensed PT), OT (By licensed OT)     PT Frequency: 3 x week OT Frequency: 3 x week            Contractures Contractures Info: Not present    Additional Factors Info  Code Status, Allergies Code Status Info: Full code Allergies Info: Levofloxacin           Current Medications (06/09/2022):  This is the current hospital active medication list Current Facility-Administered Medications  Medication Dose Route Frequency Provider Last Rate Last Admin   0.9 %  sodium chloride infusion  250 mL Intravenous PRN Agbata, Tochukwu, MD       0.9 %  sodium chloride infusion   Intravenous PRN Agbata, Tochukwu, MD 10 mL/hr at 06/07/22 0525 New Bag at 06/07/22 0525   acetaminophen (TYLENOL) tablet 650 mg  650 mg Oral Q6H PRN Agbata, Tochukwu, MD   650 mg at 06/06/22 2137   Or   acetaminophen (TYLENOL) suppository 650 mg  650 mg Rectal Q6H PRN Agbata, Tochukwu, MD       allopurinol (ZYLOPRIM) tablet 100 mg  100 mg Oral Daily Agbata, Tochukwu, MD   100 mg at 06/09/22 1207   Ampicillin-Sulbactam (UNASYN) 3 g in sodium chloride 0.9 % 100 mL IVPB  3 g Intravenous Q6H Nazari, Walid A, RPH 200 mL/hr at 06/09/22 1210 3 g at 06/09/22 1210   enoxaparin (LOVENOX) injection 40 mg  40 mg Subcutaneous Daily Agbata, Tochukwu, MD   40 mg  at 06/09/22 1206   LORazepam (ATIVAN) tablet 0.5 mg  0.5 mg Oral BID PRN Agbata, Tochukwu, MD   0.5 mg at 06/08/22 1751   memantine (NAMENDA) tablet 5 mg  5 mg Oral BID Agbata, Tochukwu, MD   5 mg at 06/09/22 1207   ondansetron (ZOFRAN) tablet 4 mg  4 mg Oral Q6H PRN Agbata, Tochukwu, MD       Or   ondansetron (ZOFRAN) injection 4 mg  4 mg Intravenous Q6H PRN Agbata, Tochukwu, MD       Oral care mouth rinse  15 mL Mouth Rinse 4 times per day Agbata, Tochukwu, MD   15 mL at 06/08/22 2104   Oral care mouth rinse  15 mL Mouth Rinse PRN Agbata, Tochukwu, MD       psyllium (HYDROCIL/METAMUCIL) 1  packet  1 packet Oral BID Madueme, Doralee Masson, RPH   1 packet at 06/08/22 2103   QUEtiapine (SEROQUEL) tablet 25 mg  25 mg Oral QHS Agbata, Tochukwu, MD   25 mg at 06/08/22 2103   senna-docusate (Senokot-S) tablet 1 tablet  1 tablet Oral QHS Agbata, Tochukwu, MD   1 tablet at 06/08/22 2103   sodium chloride flush (NS) 0.9 % injection 3 mL  3 mL Intravenous Q12H Agbata, Tochukwu, MD   3 mL at 06/08/22 2100   sodium chloride flush (NS) 0.9 % injection 3 mL  3 mL Intravenous PRN Agbata, Tochukwu, MD         Discharge Medications: Please see discharge summary for a list of discharge medications.  Relevant Imaging Results:  Relevant Lab Results:   Additional Information SS#: [id number]  Margarito Liner, LCSW

## 2022-06-09 NOTE — Discharge Summary (Signed)
Physician Discharge Summary   Patient: Monique Johnson MRN: [id number] DOB: 05-May-1941  Admit date:     06/06/2022  Discharge date: 06/09/22  Discharge Physician: Marrion Coy   PCP: System, Provider Not In   Recommendations at discharge:   Follow-up with your PCP in 1 week. Refer to [facility] care.  Discharge Diagnoses: Principal Problem:   Pneumonia Active Problems:   Essential hypertension   Dementia with behavioral disturbance (HCC)   Acute metabolic encephalopathy   Pressure injury of skin  Resolved Problems:   * No resolved hospital problems. *  Hospital Course: Monique Johnson is a 81 y.o. female with medical history significant for hypertension, Alzheimer's dementia, gout and prediabetes who was recently discharged from the hospital on 06/04/22 after treatment for sepsis from multifocal pneumonia. She was brought back to the ER for evaluation of mental status changes.  CT scan showed right upper lobe pneumonia consistent with aspiration pneumonia.  Patient was started on Unasyn.  Assessment and Plan: Right upper lobe aspiration pneumonia. Acute metabolic encephalopathy secondary to pneumonia. Dementia with behavioral disturbance Patient condition had improved, no agitation.  Still confused as at baseline. Started antibiotics with Unasyn, condition had improved, will transition to oral Augmentin for additional 3 days. Patient has been seen by speech therapy, on dysphagia 2 diet.  However, patient will not be able to follow speech therapy recommendation due to dementia.  She will be at risk of recurrent aspiration pneumonia. Patient need to follow-up with palliative care  Essential hypertension. Resume some blood pressure medicines      Consultants: None Procedures performed: None  Disposition: Assisted living Diet recommendation:  Discharge Diet Orders (From admission, onward)     Start     Ordered   06/09/22 0000  Diet general       Comments: Dysphagia 2 diet    06/09/22 1405           Dysphagia type 2 thin Liquid DISCHARGE MEDICATION: Allergies as of 06/09/2022       Reactions   Levofloxacin Other (See Comments)   RED AND HOT        Medication List     STOP taking these medications    losartan 50 MG tablet Commonly known as: COZAAR       TAKE these medications    allopurinol 100 MG tablet Commonly known as: ZYLOPRIM Take 100 mg by mouth daily.   amoxicillin-clavulanate 875-125 MG tablet Commonly known as: AUGMENTIN Take 1 tablet by mouth 2 (two) times daily for 3 days.   CALCIUM CARBONATE-VITAMIN D3 PO Take 1 capsule by mouth daily.   LORazepam 0.5 MG tablet Commonly known as: ATIVAN Take 0.5 mg by mouth 2 (two) times daily as needed.   memantine 5 MG tablet Commonly known as: NAMENDA Take 5 mg by mouth 2 (two) times daily.   MULTIVITAMIN/IRON PO Take 1 tablet by mouth daily.   psyllium 28 % packet Commonly known as: METAMUCIL SMOOTH TEXTURE Take 1 packet by mouth 2 (two) times daily.   QUEtiapine 25 MG tablet Commonly known as: SEROQUEL Take 25 mg by mouth at bedtime. What changed: Another medication with the same name was removed. Continue taking this medication, and follow the directions you see here.   senna-docusate 8.6-50 MG tablet Commonly known as: Senokot-S Take 1 tablet by mouth at bedtime.   Zinc 220 (50 Zn) MG Caps Take 1 capsule by mouth daily.               Durable  Medical Equipment  (From admission, onward)           Start     Ordered   06/08/22 1241  For home use only DME high strength lightweight manual wheelchair with seat cushion  Once       Comments: Patient suffers from aspiration pbneumonia with dementia which impairs their ability to perform daily activities like bathing, dressing, grooming, and toileting in the home.  A walker will not resolve  issue with performing activities of daily living. A wheelchair will allow patient to safely perform daily  activities.Length of need Lifetime. (THEN ONE OF THESE TWO:) Patient self-propels the wheelchair while engaging in frequent activities such as meals and toileting which cannot be performed in a standard or lightweight wheelchair due to the weight of the chair. Accessories: elevating leg rests (ELRs), wheel locks, extensions and anti-tippers.   06/08/22 1241   06/08/22 1240  For home use only DME Walker rolling  Once       Question Answer Comment  Walker: With 5 Inch Wheels   Patient needs a walker to treat with the following condition Pneumonia      06/08/22 1240              Discharge Care Instructions  (From admission, onward)           Start     Ordered   06/09/22 0000  Discharge wound care:       Comments: Follow with RN   06/09/22 1405            Discharge Exam: There were no vitals filed for this visit. General exam: Appears calm and comfortable  Respiratory system: Clear to auscultation. Respiratory effort normal. Cardiovascular system: S1 & S2 heard, RRR. No JVD, murmurs, rubs, gallops or clicks. No pedal edema. Gastrointestinal system: Abdomen is nondistended, soft and nontender. No organomegaly or masses felt. Normal bowel sounds heard. Central nervous system: Alert and oriented x1. No focal neurological deficits. Extremities: Symmetric 5 x 5 power. Skin: No rashes, lesions or ulcers    Condition at discharge: fair  The results of significant diagnostics from this hospitalization (including imaging, microbiology, ancillary and laboratory) are listed below for reference.   Imaging Studies: ECHOCARDIOGRAM COMPLETE  Result Date: 06/07/2022    ECHOCARDIOGRAM REPORT   Patient Name:   Monique Johnson Date of Exam: 06/07/2022 Medical Rec #:  [id number]      Height:       63.0 in Accession #:    [id number]     Weight:       141.5 lb Date of Birth:  11-Apr-1941      BSA:          1.669 m Patient Age:    39 years       BP:           135/75 mmHg Patient Gender: F               HR:           54 bpm. Exam Location:  [facility] Procedure: 2D Echo Indications:     CHF I50.31  History:         Patient has no prior history of Echocardiogram examinations.  Sonographer:     Overton Mam RDCS Referring Phys:  [id number] AGBATA Diagnosing Phys: Sena Slate IMPRESSIONS  1. Left ventricular ejection fraction, by estimation, is 50 to 55%. Left ventricular ejection fraction by 2D MOD biplane is 52.4 %. The left ventricle has  low normal function. The left ventricle has no regional wall motion abnormalities. Left ventricular diastolic parameters are consistent with Grade I diastolic dysfunction (impaired relaxation).  2. Right ventricular systolic function is normal. The right ventricular size is normal.  3. The mitral valve is normal in structure. Trivial mitral valve regurgitation.  4. The aortic valve is normal in structure. Aortic valve regurgitation is not visualized. No aortic stenosis is present.  5. The inferior vena cava is dilated in size with <50% respiratory variability, suggesting right atrial pressure of 15 mmHg. FINDINGS  Left Ventricle: Left ventricular ejection fraction, by estimation, is 50 to 55%. Left ventricular ejection fraction by 2D MOD biplane is 52.4 %. The left ventricle has low normal function. The left ventricle has no regional wall motion abnormalities. The left ventricular internal cavity size was normal in size. There is no left ventricular hypertrophy. Left ventricular diastolic parameters are consistent with Grade I diastolic dysfunction (impaired relaxation). Right Ventricle: The right ventricular size is normal. Right vetricular wall thickness was not well visualized. Right ventricular systolic function is normal. Left Atrium: Left atrial size was normal in size. Right Atrium: Right atrial size was normal in size. Pericardium: There is no evidence of pericardial effusion. Mitral Valve: The mitral valve is normal in structure. Trivial mitral valve regurgitation.  Tricuspid Valve: The tricuspid valve is normal in structure. Tricuspid valve regurgitation is mild. Aortic Valve: The aortic valve is normal in structure. Aortic valve regurgitation is not visualized. No aortic stenosis is present. Aortic valve peak gradient measures 8.2 mmHg. Pulmonic Valve: The pulmonic valve was grossly normal. Pulmonic valve regurgitation is not visualized. No evidence of pulmonic stenosis. Aorta: The aortic root is normal in size and structure. Venous: The inferior vena cava is dilated in size with less than 50% respiratory variability, suggesting right atrial pressure of 15 mmHg. IAS/Shunts: No atrial level shunt detected by color flow Doppler.  LEFT VENTRICLE PLAX 2D                        Biplane EF (MOD) LVIDd:         4.38 cm         LV Biplane EF:   Left LVIDs:         3.27 cm                          ventricular LV PW:         0.98 cm                          ejection LV IVS:        1.07 cm                          fraction by LVOT diam:     1.90 cm                          2D MOD LV SV:         58                               biplane is LV SV Index:   35  52.4 %. LVOT Area:     2.84 cm                                Diastology                                LV e' medial:    6.20 cm/s LV Volumes (MOD)               LV E/e' medial:  12.5 LV vol d, MOD    92.3 ml       LV e' lateral:   7.18 cm/s A2C:                           LV E/e' lateral: 10.8 LV vol d, MOD    72.8 ml A4C: LV vol s, MOD    42.6 ml A2C: LV vol s, MOD    35.7 ml A4C: LV SV MOD A2C:   49.7 ml LV SV MOD A4C:   72.8 ml LV SV MOD BP:    43.0 ml RIGHT VENTRICLE RV Basal diam:  2.94 cm RV S prime:     16.40 cm/s TAPSE (M-mode): 1.8 cm LEFT ATRIUM           Index        RIGHT ATRIUM           Index LA diam:      4.50 cm 2.70 cm/m   RA Area:     13.70 cm LA Vol (A2C): 43.5 ml 26.06 ml/m  RA Volume:   30.30 ml  18.15 ml/m LA Vol (A4C): 54.6 ml 32.71 ml/m  AORTIC VALVE                 PULMONIC  VALVE AV Area (Vmax): 1.72 cm     PV Vmax:       1.13 m/s AV Vmax:        143.00 cm/s  PV Peak grad:  5.1 mmHg AV Peak Grad:   8.2 mmHg LVOT Vmax:      86.80 cm/s LVOT Vmean:     54.100 cm/s LVOT VTI:       0.204 m  AORTA Ao Root diam: 3.30 cm MITRAL VALVE               TRICUSPID VALVE MV Area (PHT): 2.35 cm    TV Peak grad:   26.4 mmHg MV Decel Time: 323 msec    TV Vmax:        2.57 m/s MV E velocity: 77.60 cm/s MV A velocity: 82.70 cm/s  SHUNTS MV E/A ratio:  0.94        Systemic VTI:  0.20 m                            Systemic Diam: 1.90 cm Sena Slate Electronically signed by Sena Slate Signature Date/Time: 06/07/2022/9:31:29 AM    Final    CT CHEST WO CONTRAST  Result Date: 06/06/2022 CLINICAL DATA:  Pneumonia EXAM: CT CHEST WITHOUT CONTRAST TECHNIQUE: Multidetector CT imaging of the chest was performed following the standard protocol without IV contrast. RADIATION DOSE REDUCTION: This exam was performed according to the departmental dose-optimization program which includes automated exposure control, adjustment of the mA and/or kV according to patient  size and/or use of iterative reconstruction technique. COMPARISON:  Chest radiograph 06/06/2022 FINDINGS: Cardiovascular: Atherosclerotic calcification aorta and coronary arteries. Aorta normal caliber. Enlargement of cardiac chambers. Trace pericardial effusion. Mediastinum/Nodes: Esophagus unremarkable. Tiny RIGHT thyroid nodule 6 mm diameter; Not clinically significant; no follow-up imaging recommended (ref: J Am Coll Radiol. 2015 Feb;12(2): 143-50). Normal size mediastinal lymph nodes. No thoracic adenopathy. Lungs/Pleura: Small BILATERAL pleural effusions. Airspace consolidation RIGHT upper lobe favoring pneumonia, aspiration not entirely excluded. Significant atelectasis BILATERAL lower lobes and in anterior segment RIGHT upper lobe, minimally at base of lingula. No pneumothorax. Upper Abdomen: Small hepatic cysts up to 9 mm diameter. Thickening of  BILATERAL adrenal glands. Remaining visualized upper abdomen unremarkable. Musculoskeletal: No acute osseous findings. IMPRESSION: Small BILATERAL pleural effusions with significant bibasilar atelectasis. Airspace consolidation in RIGHT upper lobe consistent with pneumonia, though aspiration is not entirely excluded. Radiographic follow-up until resolution recommended to exclude underlying pulmonary abnormalities including tumor. Scattered atelectasis in BILATERAL lower lobes and anterior segment RIGHT upper lobe. Scattered atherosclerotic calcifications including coronary arteries. Enlargement of cardiac chambers with trace pericardial effusion. Aortic Atherosclerosis (ICD10-I70.0). Electronically Signed   By: Ulyses Southward M.D.   On: 06/06/2022 11:51   DG Chest Portable 1 View  Result Date: 06/06/2022 CLINICAL DATA:  Altered mental status and cough EXAM: PORTABLE CHEST 1 VIEW COMPARISON:  Chest x-ray dated June 01, 2022 FINDINGS: Cardiac and mediastinal contours are unchanged when accounting for rightward patient rotation. Persistent right perihilar and right upper lobe consolidations. New left lower lung opacity which is likely due to atelectasis and small layering pleural effusion. No pneumothorax. IMPRESSION: 1. Persistent right perihilar and right upper lobe consolidations, concerning for multifocal infection. Recommend radiographic follow-up to ensure resolution. 2. New left lower lung opacity which is likely due to atelectasis and small layering pleural effusion. Electronically Signed   By: Allegra Lai M.D.   On: 06/06/2022 09:46   CT HEAD WO CONTRAST ( )  Result Date: 06/06/2022 CLINICAL DATA:  Provided history: Mental status change, unknown cause. EXAM: CT HEAD WITHOUT CONTRAST TECHNIQUE: Contiguous axial images were obtained from the base of the skull through the vertex without intravenous contrast. RADIATION DOSE REDUCTION: This exam was performed according to the departmental dose-optimization  program which includes automated exposure control, adjustment of the mA and/or kV according to patient size and/or use of iterative reconstruction technique. COMPARISON:  Head CT 06/01/2022. FINDINGS: Brain: No age advanced or lobar predominant parenchymal atrophy. Mild patchy and ill-defined hypoattenuation within the cerebral white matter, nonspecific but compatible with chronic small vessel ischemic disease. There is no acute intracranial hemorrhage. No demarcated cortical infarct. No extra-axial fluid collection. No evidence of an intracranial mass. No midline shift. Vascular: No hyperdense vessel. Atherosclerotic calcifications. Skull: No fracture or aggressive osseous lesion. Sinuses/Orbits: No mass or acute finding within the imaged orbits. No significant paranasal sinus disease. IMPRESSION: No evidence of acute intracranial abnormality. Mild chronic small vessel ischemic changes within the cerebral white matter. Electronically Signed   By: Jackey Loge D.O.   On: 06/06/2022 07:52   CT HEAD WO CONTRAST ( )  Result Date: 06/01/2022 CLINICAL DATA:  Head trauma, minor (Age >= 65y) Dementia patient, more alter than usual. EXAM: CT HEAD WITHOUT CONTRAST TECHNIQUE: Contiguous axial images were obtained from the base of the skull through the vertex without intravenous contrast. RADIATION DOSE REDUCTION: This exam was performed according to the departmental dose-optimization program which includes automated exposure control, adjustment of the mA and/or kV according to patient size and/or use  of iterative reconstruction technique. COMPARISON:  Brain MRI 12/16/2019 FINDINGS: Brain: No intracranial hemorrhage, mass effect, or midline shift. Normal for age brain volume. No hydrocephalus. The basilar cisterns are patent. Mild periventricular chronic small vessel ischemia. No evidence of territorial infarct or acute ischemia. No extra-axial or intracranial fluid collection. Vascular: No hyperdense vessel or unexpected  calcification. Skull: No fracture or focal lesion.  Calvarial hyperostosis. Sinuses/Orbits: Paranasal sinuses and mastoid air cells are clear. The visualized orbits are unremarkable. Bilateral cataract resection. Other: None. IMPRESSION: 1. No acute intracranial abnormality. No skull fracture. 2. Mild chronic small vessel ischemia. Electronically Signed   By: Narda Rutherford M.D.   On: 06/01/2022 21:36   DG Chest Port 1 View  Result Date: 06/01/2022 CLINICAL DATA:  Questionable sepsis - evaluate for abnormality Altered mental status. EXAM: PORTABLE CHEST 1 VIEW COMPARISON:  None Available. FINDINGS: Lung volumes are low. Mild cardiomegaly. Tortuous thoracic aorta. Multifocal airspace consolidation, most prominent involving the right upper lobe, lesser within both lung bases. No pulmonary edema. Previous small right pleural effusion. No pneumothorax. IMPRESSION: 1. Multifocal airspace consolidation throughout both lungs, greatest involving the right upper lobe, suspicious for multifocal pneumonia. Recommend radiographic follow-up to ensure resolution after course of treatment to exclude the possibility of underlying neoplastic process. 2. Cardiomegaly with tortuous thoracic aorta. Electronically Signed   By: Narda Rutherford M.D.   On: 06/01/2022 20:33    Microbiology: Results for orders placed or performed during the hospital encounter of 06/01/22  Resp Panel by RT-PCR (Flu A&B, Covid) Anterior Nasal Swab     Status: None   Collection Time: 06/01/22  8:07 PM   Specimen: Anterior Nasal Swab  Result Value Ref Range Status   SARS Coronavirus 2 by RT PCR NEGATIVE NEGATIVE Final    Comment: (NOTE) SARS-CoV-2 target nucleic acids are NOT DETECTED.  The SARS-CoV-2 RNA is generally detectable in upper respiratory specimens during the acute phase of infection. The lowest concentration of SARS-CoV-2 viral copies this assay can detect is 138 copies/mL. A negative result does not preclude  SARS-Cov-2 infection and should not be used as the sole basis for treatment or other patient management decisions. A negative result may occur with  improper specimen collection/handling, submission of specimen other than nasopharyngeal swab, presence of viral mutation(s) within the areas targeted by this assay, and inadequate number of viral copies(<138 copies/mL). A negative result must be combined with clinical observations, patient history, and epidemiological information. The expected result is Negative.  Fact Sheet for Patients:  BloggerCourse.com  Fact Sheet for Healthcare Providers:  SeriousBroker.it  This test is no t yet approved or cleared by the [place] FDA and  has been authorized for detection and/or diagnosis of SARS-CoV-2 by FDA under an Emergency Use Authorization (EUA). This EUA will remain  in effect (meaning this test can be used) for the duration of the COVID-19 declaration under Section 564(b)(1) of the Act, 21 U.S.C.section 360bbb-3(b)(1), unless the authorization is terminated  or revoked sooner.       Influenza A by PCR NEGATIVE NEGATIVE Final   Influenza B by PCR NEGATIVE NEGATIVE Final    Comment: (NOTE) The Xpert Xpress SARS-CoV-2/FLU/RSV plus assay is intended as an aid in the diagnosis of influenza from Nasopharyngeal swab specimens and should not be used as a sole basis for treatment. Nasal washings and aspirates are unacceptable for Xpert Xpress SARS-CoV-2/FLU/RSV testing.  Fact Sheet for Patients: BloggerCourse.com  Fact Sheet for Healthcare Providers: SeriousBroker.it  This test is not yet  approved or cleared by the [place] and has been authorized for detection and/or diagnosis of SARS-CoV-2 by FDA under an Emergency Use Authorization (EUA). This EUA will remain in effect (meaning this test can be used) for the duration of  the COVID-19 declaration under Section 564(b)(1) of the Act, 21 U.S.C. section 360bbb-3(b)(1), unless the authorization is terminated or revoked.  Performed at [facility], [address] [place], [place] [address]   Blood Culture (routine x 2)     Status: None   Collection Time: 06/01/22  8:07 PM   Specimen: BLOOD  Result Value Ref Range Status   Specimen Description BLOOD BLRA  Final   Special Requests BOTTLES DRAWN AEROBIC AND ANAEROBIC BCLV  Final   Culture   Final    NO GROWTH 5 DAYS Performed at [facility], [address] [place], [place] [address]    Report Status 06/06/2022 FINAL  Final  Blood Culture (routine x 2)     Status: None   Collection Time: 06/01/22  8:09 PM   Specimen: BLOOD  Result Value Ref Range Status   Specimen Description BLOOD BLLA  Final   Special Requests BOTTLES DRAWN AEROBIC AND ANAEROBIC BCLV  Final   Culture   Final    NO GROWTH 5 DAYS Performed at [facility], [address], [place], [place] [address]    Report Status 06/06/2022 FINAL  Final  Urine Culture     Status: Abnormal   Collection Time: 06/01/22  8:09 PM   Specimen: In/Out Cath Urine  Result Value Ref Range Status   Specimen Description   Final    IN/OUT CATH URINE Performed at [facility], [address] [place], [place] [address]    Special Requests   Final    NONE Performed at [facility], [address] [place], [place] [address]    Culture (A)  Final    >=100,000 COLONIES/mL LACTOBACILLUS SPECIES Standardized susceptibility testing for this organism is not available. Performed at [facility], [address], [place], [place] [address]    Report Status 06/03/2022 FINAL  Final    Labs: CBC: Recent Labs  Lab 06/04/22 0823 06/06/22 0709 06/07/22 0619  WBC 8.3 13.6* 9.0  NEUTROABS 6.5  --   --   HGB 11.0* 10.9* 9.7*  HCT 33.5* 34.0* 30.6*  MCV 92.5 93.9 94.4  PLT 197 238 227   Basic Metabolic  Panel: Recent Labs  Lab 06/03/22 0545 06/04/22 0823 06/06/22 0709 06/07/22 0619  NA 142 143 141 142  K 3.6 4.0 3.9 4.2  CL 116* 114* 112* 116*  CO2 22 25 23 23   GLUCOSE 113* 92 113* 98  BUN 35* 23 27* 23  CREATININE 0.99 0.84 1.14* 0.89  CALCIUM 8.6* 9.3 9.1 9.0  MG 1.8  --   --   --    Liver Function Tests: Recent Labs  Lab 06/06/22 0709  AST 32  ALT 26  ALKPHOS 64  BILITOT 0.5  PROT 5.8*  ALBUMIN 2.5*   CBG: No results for input(s): "GLUCAP" in the last 168 hours.  Discharge time spent: greater than 30 minutes.  Signed: 08/07/22, MD [facility] 06/09/2022

## 2022-06-09 NOTE — TOC Progression Note (Addendum)
Transition of Care [facility]) - Progression Note    Patient Details  Name: Monique Johnson MRN: [id number] Date of Birth: [birth year]  Transition of Care [facility]) CM/SW Contact  Margarito Liner, LCSW Phone Number: 06/09/2022, 1:37 PM  Clinical Narrative:  Left voicemail for Cervante at [facility] to check status of assessment for patient to return.   3:18 pm: Left voicemail on Cervante's cell phone.  3:59 pm: Received call back from Cervante. She will come assess patient within the next hour and won't have an answer until 5:30/6:00. Will plan for discharge tomorrow if they can accept her back. Faxed preliminary FL2 and discharge summary for review. [employer] trying to get in touch with sister regarding DME.  Expected Discharge Plan: Home w Home Health Services Barriers to Discharge: Continued Medical Work up  Expected Discharge Plan and Services Expected Discharge Plan: Home w Home Health Services       Living arrangements for the past 2 months: Assisted Living Facility                           HH Arranged: PT, OT, RN [facility] Agency: [facility] Date [facility] Agency Contacted: 06/08/22   Representative spoke with at [facility] Agency: Elnita Maxwell   Social Determinants of Health (SDOH) Interventions    Readmission Risk Interventions    06/08/2022   11:01 AM  Readmission Risk Prevention Plan  Post Dischage Appt Complete  Medication Screening Complete  Transportation Screening Complete

## 2022-06-09 NOTE — Progress Notes (Signed)
Physical Therapy Treatment Patient Details Name: Monique Johnson MRN: [id number] DOB: [birth year] Today's Date: 06/09/2022   History of Present Illness Pt is a 81 yo female that presented to ED for AMS, CT showed aspiration PNA. Recently discharged from the hospital 06/04/2022 after sepsis 2/2 mulitfocal PNA. PMH of alzheimers, HTN, gout, prediabetes.   PT Comments    Pt alert and oriented to person, DOB, place stating 'hospital', but disoriented to year and situation. Pt required Mod A for supine to sit EOB due to truncal support and scooting forward to place feet on floor. Pt Min A to stand with verbal cues for technique and RW utilized in standing while nursing Max A for pericare in standing due to pt having a BM. Pt completed step pivot transfer to chair with Min A, RW, and verbal cues with pt completing very small steps forward, turning, backward, and sidestepping in order to align with chair. Pt was able to follow simple commands with increased processing time and when commands were stated multiple times. Continue to recommend return to ALF memory care with [facility] PT.   Recommendations for follow up therapy are one component of a multi-disciplinary discharge planning process, led by the attending physician.  Recommendations may be updated based on patient status, additional functional criteria and insurance authorization.  Follow Up Recommendations  Home health PT (Return to ALF Memory Care Unit) Can patient physically be transported by private vehicle: Yes   Assistance Recommended at Discharge Frequent or constant Supervision/Assistance  Patient can return home with the following A little help with walking and/or transfers;A little help with bathing/dressing/bathroom;Direct supervision/assist for medications management;Direct supervision/assist for financial management;Assist for transportation;Assistance with cooking/housework;Assistance with feeding;Help with stairs or ramp for entrance    Equipment Recommendations  Rolling walker (2 wheels);Wheelchair (measurements PT);Wheelchair cushion (measurements PT)    Recommendations for Other Services       Precautions / Restrictions Precautions Precautions: Fall Restrictions Weight Bearing Restrictions: No     Mobility  Bed Mobility Overal bed mobility: Needs Assistance Bed Mobility: Supine to Sit     Supine to sit: Mod assist     General bed mobility comments: increased time, effort, and Mod A for truncal support and scooting forward to EOB to place feet on floor    Transfers Overall transfer level: Needs assistance Equipment used: Rolling walker (2 wheels) Transfers: Sit to/from Stand, Bed to chair/wheelchair/BSC Sit to Stand: Min assist   Step pivot transfers: Min assist       General transfer comment: Pt performed initial sit-to-stand with min A and RW. Pt stood with CGA and RW for ~2 minutes while nursing Max A for pericare due to pt having BM in bed. Pt then Min A for step pivot transfer to chair with heavy verbal cues. Pt followed instructions once repeated multiple times.    Ambulation/Gait Ambulation/Gait assistance: Min assist Gait Distance (Feet): 3 Feet Assistive device: Rolling walker (2 wheels) Gait Pattern/deviations: Step-to pattern, Narrow base of support, Trunk flexed, Decreased step length - right, Decreased step length - left, Decreased stride length, Decreased dorsiflexion - right, Decreased dorsiflexion - left Gait velocity: decreased     General Gait Details: Pt completed multiple small steps forward, to turn, backwards, and sidesteps to transfer from bed to chair. NBOS, small step-to gait, increased time to respond to verbal cues   Stairs             Wheelchair Mobility    Modified Rankin (Stroke Patients Only)  Balance Overall balance assessment: Needs assistance Sitting-balance support: Feet supported, Bilateral upper extremity supported Sitting balance-Leahy  Scale: Good Sitting balance - Comments: Pt able to sit EOB with feet supported and bilat UE support without LOB   Standing balance support: Bilateral upper extremity supported Standing balance-Leahy Scale: Fair Standing balance comment: Pt able to stand with bilat UE support on RW without LOB. Verbal cues to widen stance because pt initially standing with feet together.                            Cognition Arousal/Alertness: Awake/alert Behavior During Therapy: WFL for tasks assessed/performed Overall Cognitive Status: History of cognitive impairments - at baseline                                 General Comments: Pt is oriented to self, DOB, location (stated 'hospital'), but not year or situation. Pt followed simple directions after they had been repeated several times.        Exercises      General Comments        Pertinent Vitals/Pain Pain Assessment Pain Assessment: Faces Faces Pain Scale: No hurt    Home Living                          Prior Function            PT Goals (current goals can now be found in the care plan section) Acute Rehab PT Goals Patient Stated Goal: none stated PT Goal Formulation: Patient unable to participate in goal setting Time For Goal Achievement: 06/22/22 Potential to Achieve Goals: Good Progress towards PT goals: Progressing toward goals    Frequency    Min 2X/week      PT Plan Current plan remains appropriate    Co-evaluation              AM-PAC PT "6 Clicks" Mobility   Outcome Measure  Help needed turning from your back to your side while in a flat bed without using bedrails?: A Little Help needed moving from lying on your back to sitting on the side of a flat bed without using bedrails?: A Lot Help needed moving to and from a bed to a chair (including a wheelchair)?: A Little Help needed standing up from a chair using your arms (e.g., wheelchair or bedside chair)?: A Little Help  needed to walk in hospital room?: A Little Help needed climbing 3-5 steps with a railing? : A Lot 6 Click Score: 16    End of Session Equipment Utilized During Treatment: Gait belt Activity Tolerance: Patient tolerated treatment well Patient left: in chair;with call bell/phone within reach;with chair alarm set;with nursing/sitter in room Nurse Communication: Mobility status PT Visit Diagnosis: Unsteadiness on feet (R26.81);Muscle weakness (generalized) (M62.81)     Time: 6295-2841 PT Time Calculation (min) (ACUTE ONLY): 22 min  Charges:                        Danelle Curiale, SPT  Elli Groesbeck 06/09/2022, 1:52 PM

## 2022-06-09 NOTE — Care Management Important Message (Addendum)
Important Message  Patient Details  Name: Monique Johnson MRN: [id number] Date of Birth: 1941-04-23   Medicare Important Message Given:  Yes  Left message with sister to review Medicare IM.  Copy of Medicare IM left in room for reference.  Update 12:27pm: Received return call from [place], sister.  Reviewed medicare IM. Confirmed she received the copy left in patient's room.   Johnell Comings 06/09/2022, 12:00 PM

## 2022-06-10 DIAGNOSIS — J690 Pneumonitis due to inhalation of food and vomit: Secondary | ICD-10-CM | POA: Diagnosis not present

## 2022-06-10 DIAGNOSIS — G9341 Metabolic encephalopathy: Secondary | ICD-10-CM | POA: Diagnosis not present

## 2022-06-10 DIAGNOSIS — F03918 Unspecified dementia, unspecified severity, with other behavioral disturbance: Secondary | ICD-10-CM | POA: Diagnosis not present

## 2022-06-10 MED ORDER — LORAZEPAM 0.5 MG PO TABS: 0.5000 mg | ORAL_TABLET | Freq: Two times a day (BID) | ORAL | 0 refills | Status: DC | PRN

## 2022-06-10 NOTE — Discharge Summary (Addendum)
Physician Discharge Summary   Patient: Monique Johnson MRN: [id number] DOB: 1941-07-22  Admit date:     06/06/2022  Discharge date: 06/10/22  Discharge Physician: Marrion Coy   PCP: System, Provider Not In   Recommendations at discharge:    Follow-up with your PCP in 1 week. Refer to [facility] care.  Discharge Diagnoses: Principal Problem:   Pneumonia Active Problems:   Essential hypertension   Dementia with behavioral disturbance (HCC)   Acute metabolic encephalopathy   Pressure injury of skin  Resolved Problems:   * No resolved hospital problems. *  Hospital Course: Monique Johnson is a 81 y.o. female with medical history significant for hypertension, Alzheimer's dementia, gout and prediabetes who was recently discharged from the hospital on 06/04/22 after treatment for sepsis from multifocal pneumonia. She was brought back to the ER for evaluation of mental status changes.  CT scan showed right upper lobe pneumonia consistent with aspiration pneumonia.  Patient was started on Unasyn.  Assessment and Plan: Right upper lobe aspiration pneumonia. Acute metabolic encephalopathy secondary to pneumonia. Dementia with behavioral disturbance Patient condition had improved, no agitation.  Still confused as at baseline. Started antibiotics with Unasyn, condition had improved, will transition to oral Augmentin for additional 3 days. Patient has been seen by speech therapy, on dysphagia 2 diet.  However, patient will not be able to follow speech therapy recommendation due to dementia.  She will be at risk of recurrent aspiration pneumonia. Patient need to follow-up with palliative care Discussed with speech, ALF does not accept dys 2 diet, ok to change to mechanical soft diet  Essential hypertension. Resume some blood pressure medicines   Patient was scheduled to be transferred to assisted living facility yesterday, was not able to go.  There is no new event for patient.  She will be  transferred today.  Pressure ulcer POA. Follow with PCP and visiting RN  Pressure Injury 06/07/22 Coccyx Medial Stage 2 -  Partial thickness loss of dermis presenting as a shallow open injury with a red, pink wound bed without slough. Red open sore (Active)  06/07/22 1455  Location: Coccyx  Location Orientation: Medial  Staging: Stage 2 -  Partial thickness loss of dermis presenting as a shallow open injury with a red, pink wound bed without slough.  Wound Description (Comments): Red open sore  Present on Admission:          Consultants: None Procedures performed: None  Disposition: Assisted living Diet recommendation:  Discharge Diet Orders (From admission, onward)     Start     Ordered   06/09/22 0000  Diet general       Comments: Mechanical soft diet   06/09/22 1405           Dysphagia type 2 thin Liquid   Change to mechanical soft diet with thin liquid DISCHARGE MEDICATION: Allergies as of 06/10/2022       Reactions   Levofloxacin Other (See Comments)   RED AND HOT        Medication List     STOP taking these medications    losartan 50 MG tablet Commonly known as: COZAAR       TAKE these medications    allopurinol 100 MG tablet Commonly known as: ZYLOPRIM Take 100 mg by mouth daily.   amoxicillin-clavulanate 875-125 MG tablet Commonly known as: AUGMENTIN Take 1 tablet by mouth 2 (two) times daily for 3 days.   CALCIUM CARBONATE-VITAMIN D3 PO Take 1 capsule by mouth daily.  LORazepam 0.5 MG tablet Commonly known as: ATIVAN Take 0.5 mg by mouth 2 (two) times daily as needed.   memantine 5 MG tablet Commonly known as: NAMENDA Take 5 mg by mouth 2 (two) times daily.   MULTIVITAMIN/IRON PO Take 1 tablet by mouth daily.   psyllium 28 % packet Commonly known as: METAMUCIL SMOOTH TEXTURE Take 1 packet by mouth 2 (two) times daily.   QUEtiapine 25 MG tablet Commonly known as: SEROQUEL Take 25 mg by mouth at bedtime. What changed: Another  medication with the same name was removed. Continue taking this medication, and follow the directions you see here.   senna-docusate 8.6-50 MG tablet Commonly known as: Senokot-S Take 1 tablet by mouth at bedtime.   Zinc 220 (50 Zn) MG Caps Take 1 capsule by mouth daily.               Durable Medical Equipment  (From admission, onward)           Start     Ordered   06/08/22 1241  For home use only DME high strength lightweight manual wheelchair with seat cushion  Once       Comments: Patient suffers from aspiration pbneumonia with dementia which impairs their ability to perform daily activities like bathing, dressing, grooming, and toileting in the home.  A walker will not resolve  issue with performing activities of daily living. A wheelchair will allow patient to safely perform daily activities.Length of need Lifetime. (THEN ONE OF THESE TWO:) Patient self-propels the wheelchair while engaging in frequent activities such as meals and toileting which cannot be performed in a standard or lightweight wheelchair due to the weight of the chair. Accessories: elevating leg rests (ELRs), wheel locks, extensions and anti-tippers.   06/08/22 1241   06/08/22 1240  For home use only DME Walker rolling  Once       Question Answer Comment  Walker: With 5 Inch Wheels   Patient needs a walker to treat with the following condition Pneumonia      06/08/22 1240              Discharge Care Instructions  (From admission, onward)           Start     Ordered   06/09/22 0000  Discharge wound care:       Comments: Follow with RN   06/09/22 1405            Discharge Exam: Filed Weights   06/09/22 2009  Weight: 64.2 kg   General exam: Appears calm and comfortable  Respiratory system: Clear to auscultation. Respiratory effort normal. Cardiovascular system: S1 & S2 heard, RRR. No JVD, murmurs, rubs, gallops or clicks. No pedal edema. Gastrointestinal system: Abdomen is  nondistended, soft and nontender. No organomegaly or masses felt. Normal bowel sounds heard. Central nervous system: Alert and oriented x1. No focal neurological deficits. Extremities: Symmetric 5 x 5 power. Skin: No rashes, lesions or ulcers   Condition at discharge: fair  The results of significant diagnostics from this hospitalization (including imaging, microbiology, ancillary and laboratory) are listed below for reference.   Imaging Studies: ECHOCARDIOGRAM COMPLETE  Result Date: 06/07/2022    ECHOCARDIOGRAM REPORT   Patient Name:   Monique Johnson Date of Exam: 06/07/2022 Medical Rec #:  [id number]      Height:       63.0 in Accession #:    [id number]     Weight:       141.5 lb  Date of Birth:  09-Mar-1941      BSA:          1.669 m Patient Age:    13 years       BP:           135/75 mmHg Patient Gender: F              HR:           54 bpm. Exam Location:  [facility] Procedure: 2D Echo Indications:     CHF I50.31  History:         Patient has no prior history of Echocardiogram examinations.  Sonographer:     Overton Mam RDCS Referring Phys:  [id number] AGBATA Diagnosing Phys: Sena Slate IMPRESSIONS  1. Left ventricular ejection fraction, by estimation, is 50 to 55%. Left ventricular ejection fraction by 2D MOD biplane is 52.4 %. The left ventricle has low normal function. The left ventricle has no regional wall motion abnormalities. Left ventricular diastolic parameters are consistent with Grade I diastolic dysfunction (impaired relaxation).  2. Right ventricular systolic function is normal. The right ventricular size is normal.  3. The mitral valve is normal in structure. Trivial mitral valve regurgitation.  4. The aortic valve is normal in structure. Aortic valve regurgitation is not visualized. No aortic stenosis is present.  5. The inferior vena cava is dilated in size with <50% respiratory variability, suggesting right atrial pressure of 15 mmHg. FINDINGS  Left Ventricle: Left ventricular  ejection fraction, by estimation, is 50 to 55%. Left ventricular ejection fraction by 2D MOD biplane is 52.4 %. The left ventricle has low normal function. The left ventricle has no regional wall motion abnormalities. The left ventricular internal cavity size was normal in size. There is no left ventricular hypertrophy. Left ventricular diastolic parameters are consistent with Grade I diastolic dysfunction (impaired relaxation). Right Ventricle: The right ventricular size is normal. Right vetricular wall thickness was not well visualized. Right ventricular systolic function is normal. Left Atrium: Left atrial size was normal in size. Right Atrium: Right atrial size was normal in size. Pericardium: There is no evidence of pericardial effusion. Mitral Valve: The mitral valve is normal in structure. Trivial mitral valve regurgitation. Tricuspid Valve: The tricuspid valve is normal in structure. Tricuspid valve regurgitation is mild. Aortic Valve: The aortic valve is normal in structure. Aortic valve regurgitation is not visualized. No aortic stenosis is present. Aortic valve peak gradient measures 8.2 mmHg. Pulmonic Valve: The pulmonic valve was grossly normal. Pulmonic valve regurgitation is not visualized. No evidence of pulmonic stenosis. Aorta: The aortic root is normal in size and structure. Venous: The inferior vena cava is dilated in size with less than 50% respiratory variability, suggesting right atrial pressure of 15 mmHg. IAS/Shunts: No atrial level shunt detected by color flow Doppler.  LEFT VENTRICLE PLAX 2D                        Biplane EF (MOD) LVIDd:         4.38 cm         LV Biplane EF:   Left LVIDs:         3.27 cm                          ventricular LV PW:         0.98 cm  ejection LV IVS:        1.07 cm                          fraction by LVOT diam:     1.90 cm                          2D MOD LV SV:         58                               biplane is LV SV Index:   35                                52.4 %. LVOT Area:     2.84 cm                                Diastology                                LV e' medial:    6.20 cm/s LV Volumes (MOD)               LV E/e' medial:  12.5 LV vol d, MOD    92.3 ml       LV e' lateral:   7.18 cm/s A2C:                           LV E/e' lateral: 10.8 LV vol d, MOD    72.8 ml A4C: LV vol s, MOD    42.6 ml A2C: LV vol s, MOD    35.7 ml A4C: LV SV MOD A2C:   49.7 ml LV SV MOD A4C:   72.8 ml LV SV MOD BP:    43.0 ml RIGHT VENTRICLE RV Basal diam:  2.94 cm RV S prime:     16.40 cm/s TAPSE (M-mode): 1.8 cm LEFT ATRIUM           Index        RIGHT ATRIUM           Index LA diam:      4.50 cm 2.70 cm/m   RA Area:     13.70 cm LA Vol (A2C): 43.5 ml 26.06 ml/m  RA Volume:   30.30 ml  18.15 ml/m LA Vol (A4C): 54.6 ml 32.71 ml/m  AORTIC VALVE                 PULMONIC VALVE AV Area (Vmax): 1.72 cm     PV Vmax:       1.13 m/s AV Vmax:        143.00 cm/s  PV Peak grad:  5.1 mmHg AV Peak Grad:   8.2 mmHg LVOT Vmax:      86.80 cm/s LVOT Vmean:     54.100 cm/s LVOT VTI:       0.204 m  AORTA Ao Root diam: 3.30 cm MITRAL VALVE               TRICUSPID VALVE MV Area (PHT): 2.35 cm    TV Peak grad:   26.4 mmHg MV Decel Time: 323  msec    TV Vmax:        2.57 m/s MV E velocity: 77.60 cm/s MV A velocity: 82.70 cm/s  SHUNTS MV E/A ratio:  0.94        Systemic VTI:  0.20 m                            Systemic Diam: 1.90 cm Sena Slate Electronically signed by Sena Slate Signature Date/Time: 06/07/2022/9:31:29 AM    Final    CT CHEST WO CONTRAST  Result Date: 06/06/2022 CLINICAL DATA:  Pneumonia EXAM: CT CHEST WITHOUT CONTRAST TECHNIQUE: Multidetector CT imaging of the chest was performed following the standard protocol without IV contrast. RADIATION DOSE REDUCTION: This exam was performed according to the departmental dose-optimization program which includes automated exposure control, adjustment of the mA and/or kV according to patient size and/or use of iterative  reconstruction technique. COMPARISON:  Chest radiograph 06/06/2022 FINDINGS: Cardiovascular: Atherosclerotic calcification aorta and coronary arteries. Aorta normal caliber. Enlargement of cardiac chambers. Trace pericardial effusion. Mediastinum/Nodes: Esophagus unremarkable. Tiny RIGHT thyroid nodule 6 mm diameter; Not clinically significant; no follow-up imaging recommended (ref: J Am Coll Radiol. 2015 Feb;12(2): 143-50). Normal size mediastinal lymph nodes. No thoracic adenopathy. Lungs/Pleura: Small BILATERAL pleural effusions. Airspace consolidation RIGHT upper lobe favoring pneumonia, aspiration not entirely excluded. Significant atelectasis BILATERAL lower lobes and in anterior segment RIGHT upper lobe, minimally at base of lingula. No pneumothorax. Upper Abdomen: Small hepatic cysts up to 9 mm diameter. Thickening of BILATERAL adrenal glands. Remaining visualized upper abdomen unremarkable. Musculoskeletal: No acute osseous findings. IMPRESSION: Small BILATERAL pleural effusions with significant bibasilar atelectasis. Airspace consolidation in RIGHT upper lobe consistent with pneumonia, though aspiration is not entirely excluded. Radiographic follow-up until resolution recommended to exclude underlying pulmonary abnormalities including tumor. Scattered atelectasis in BILATERAL lower lobes and anterior segment RIGHT upper lobe. Scattered atherosclerotic calcifications including coronary arteries. Enlargement of cardiac chambers with trace pericardial effusion. Aortic Atherosclerosis (ICD10-I70.0). Electronically Signed   By: Ulyses Southward M.D.   On: 06/06/2022 11:51   DG Chest Portable 1 View  Result Date: 06/06/2022 CLINICAL DATA:  Altered mental status and cough EXAM: PORTABLE CHEST 1 VIEW COMPARISON:  Chest x-ray dated June 01, 2022 FINDINGS: Cardiac and mediastinal contours are unchanged when accounting for rightward patient rotation. Persistent right perihilar and right upper lobe consolidations. New  left lower lung opacity which is likely due to atelectasis and small layering pleural effusion. No pneumothorax. IMPRESSION: 1. Persistent right perihilar and right upper lobe consolidations, concerning for multifocal infection. Recommend radiographic follow-up to ensure resolution. 2. New left lower lung opacity which is likely due to atelectasis and small layering pleural effusion. Electronically Signed   By: Allegra Lai M.D.   On: 06/06/2022 09:46   CT HEAD WO CONTRAST ( )  Result Date: 06/06/2022 CLINICAL DATA:  Provided history: Mental status change, unknown cause. EXAM: CT HEAD WITHOUT CONTRAST TECHNIQUE: Contiguous axial images were obtained from the base of the skull through the vertex without intravenous contrast. RADIATION DOSE REDUCTION: This exam was performed according to the departmental dose-optimization program which includes automated exposure control, adjustment of the mA and/or kV according to patient size and/or use of iterative reconstruction technique. COMPARISON:  Head CT 06/01/2022. FINDINGS: Brain: No age advanced or lobar predominant parenchymal atrophy. Mild patchy and ill-defined hypoattenuation within the cerebral white matter, nonspecific but compatible with chronic small vessel ischemic disease. There is no acute intracranial hemorrhage. No demarcated cortical  infarct. No extra-axial fluid collection. No evidence of an intracranial mass. No midline shift. Vascular: No hyperdense vessel. Atherosclerotic calcifications. Skull: No fracture or aggressive osseous lesion. Sinuses/Orbits: No mass or acute finding within the imaged orbits. No significant paranasal sinus disease. IMPRESSION: No evidence of acute intracranial abnormality. Mild chronic small vessel ischemic changes within the cerebral white matter. Electronically Signed   By: Jackey Loge D.O.   On: 06/06/2022 07:52   CT HEAD WO CONTRAST ( )  Result Date: 06/01/2022 CLINICAL DATA:  Head trauma, minor (Age >= 65y)  Dementia patient, more alter than usual. EXAM: CT HEAD WITHOUT CONTRAST TECHNIQUE: Contiguous axial images were obtained from the base of the skull through the vertex without intravenous contrast. RADIATION DOSE REDUCTION: This exam was performed according to the departmental dose-optimization program which includes automated exposure control, adjustment of the mA and/or kV according to patient size and/or use of iterative reconstruction technique. COMPARISON:  Brain MRI 12/16/2019 FINDINGS: Brain: No intracranial hemorrhage, mass effect, or midline shift. Normal for age brain volume. No hydrocephalus. The basilar cisterns are patent. Mild periventricular chronic small vessel ischemia. No evidence of territorial infarct or acute ischemia. No extra-axial or intracranial fluid collection. Vascular: No hyperdense vessel or unexpected calcification. Skull: No fracture or focal lesion.  Calvarial hyperostosis. Sinuses/Orbits: Paranasal sinuses and mastoid air cells are clear. The visualized orbits are unremarkable. Bilateral cataract resection. Other: None. IMPRESSION: 1. No acute intracranial abnormality. No skull fracture. 2. Mild chronic small vessel ischemia. Electronically Signed   By: Narda Rutherford M.D.   On: 06/01/2022 21:36   DG Chest Port 1 View  Result Date: 06/01/2022 CLINICAL DATA:  Questionable sepsis - evaluate for abnormality Altered mental status. EXAM: PORTABLE CHEST 1 VIEW COMPARISON:  None Available. FINDINGS: Lung volumes are low. Mild cardiomegaly. Tortuous thoracic aorta. Multifocal airspace consolidation, most prominent involving the right upper lobe, lesser within both lung bases. No pulmonary edema. Previous small right pleural effusion. No pneumothorax. IMPRESSION: 1. Multifocal airspace consolidation throughout both lungs, greatest involving the right upper lobe, suspicious for multifocal pneumonia. Recommend radiographic follow-up to ensure resolution after course of treatment to exclude  the possibility of underlying neoplastic process. 2. Cardiomegaly with tortuous thoracic aorta. Electronically Signed   By: Narda Rutherford M.D.   On: 06/01/2022 20:33    Microbiology: Results for orders placed or performed during the hospital encounter of 06/01/22  Resp Panel by RT-PCR (Flu A&B, Covid) Anterior Nasal Swab     Status: None   Collection Time: 06/01/22  8:07 PM   Specimen: Anterior Nasal Swab  Result Value Ref Range Status   SARS Coronavirus 2 by RT PCR NEGATIVE NEGATIVE Final    Comment: (NOTE) SARS-CoV-2 target nucleic acids are NOT DETECTED.  The SARS-CoV-2 RNA is generally detectable in upper respiratory specimens during the acute phase of infection. The lowest concentration of SARS-CoV-2 viral copies this assay can detect is 138 copies/mL. A negative result does not preclude SARS-Cov-2 infection and should not be used as the sole basis for treatment or other patient management decisions. A negative result may occur with  improper specimen collection/handling, submission of specimen other than nasopharyngeal swab, presence of viral mutation(s) within the areas targeted by this assay, and inadequate number of viral copies(<138 copies/mL). A negative result must be combined with clinical observations, patient history, and epidemiological information. The expected result is Negative.  Fact Sheet for Patients:  BloggerCourse.com  Fact Sheet for Healthcare Providers:  SeriousBroker.it  This test is no t yet  approved or cleared by the [place] and  has been authorized for detection and/or diagnosis of SARS-CoV-2 by FDA under an Emergency Use Authorization (EUA). This EUA will remain  in effect (meaning this test can be used) for the duration of the COVID-19 declaration under Section 564(b)(1) of the Act, 21 U.S.C.section 360bbb-3(b)(1), unless the authorization is terminated  or revoked sooner.        Influenza A by PCR NEGATIVE NEGATIVE Final   Influenza B by PCR NEGATIVE NEGATIVE Final    Comment: (NOTE) The Xpert Xpress SARS-CoV-2/FLU/RSV plus assay is intended as an aid in the diagnosis of influenza from Nasopharyngeal swab specimens and should not be used as a sole basis for treatment. Nasal washings and aspirates are unacceptable for Xpert Xpress SARS-CoV-2/FLU/RSV testing.  Fact Sheet for Patients: BloggerCourse.com  Fact Sheet for Healthcare Providers: SeriousBroker.it  This test is not yet approved or cleared by the [place] FDA and has been authorized for detection and/or diagnosis of SARS-CoV-2 by FDA under an Emergency Use Authorization (EUA). This EUA will remain in effect (meaning this test can be used) for the duration of the COVID-19 declaration under Section 564(b)(1) of the Act, 21 U.S.C. section 360bbb-3(b)(1), unless the authorization is terminated or revoked.  Performed at [facility], [address] [place], [place] [address]   Blood Culture (routine x 2)     Status: None   Collection Time: 06/01/22  8:07 PM   Specimen: BLOOD  Result Value Ref Range Status   Specimen Description BLOOD BLRA  Final   Special Requests BOTTLES DRAWN AEROBIC AND ANAEROBIC BCLV  Final   Culture   Final    NO GROWTH 5 DAYS Performed at [facility], [address] [place], [place] [address]    Report Status 06/06/2022 FINAL  Final  Blood Culture (routine x 2)     Status: None   Collection Time: 06/01/22  8:09 PM   Specimen: BLOOD  Result Value Ref Range Status   Specimen Description BLOOD BLLA  Final   Special Requests BOTTLES DRAWN AEROBIC AND ANAEROBIC BCLV  Final   Culture   Final    NO GROWTH 5 DAYS Performed at [facility], [address], [place], [place] [address]    Report Status 06/06/2022 FINAL  Final  Urine Culture     Status: Abnormal   Collection Time: 06/01/22  8:09  PM   Specimen: In/Out Cath Urine  Result Value Ref Range Status   Specimen Description   Final    IN/OUT CATH URINE Performed at [facility], [address] [place], [place] [address]    Special Requests   Final    NONE Performed at [facility], [address] [place], [place] [address]    Culture (A)  Final    >=100,000 COLONIES/mL LACTOBACILLUS SPECIES Standardized susceptibility testing for this organism is not available. Performed at [facility], [address], [place], [place] [address]    Report Status 06/03/2022 FINAL  Final    Labs: CBC: Recent Labs  Lab 06/04/22 0823 06/06/22 0709 06/07/22 0619  WBC 8.3 13.6* 9.0  NEUTROABS 6.5  --   --   HGB 11.0* 10.9* 9.7*  HCT 33.5* 34.0* 30.6*  MCV 92.5 93.9 94.4  PLT 197 238 227   Basic Metabolic Panel: Recent Labs  Lab 06/04/22 0823 06/06/22 0709 06/07/22 0619  NA 143 141 142  K 4.0 3.9 4.2  CL 114* 112*  116*  CO2 GLUCOSE 92 113* 98  BUN 23 27* 23  CREATININE 0.84 1.14* 0.89  CALCIUM 9.3 9.1 9.0   Liver Function Tests: Recent Labs  Lab 06/06/22 0709  AST 32  ALT 26  ALKPHOS 64  BILITOT 0.5  PROT 5.8*  ALBUMIN 2.5*   CBG: No results for input(s): "GLUCAP" in the last 168 hours.  Discharge time spent: less than 30 minutes.  Signed: Marrion Coy, MD [facility] 06/10/2022

## 2022-06-10 NOTE — [place] FL2 (Signed)
[place] MEDICAID FL2 LEVEL OF CARE SCREENING TOOL     IDENTIFICATION  Patient Name: Monique Johnson Birthdate: 14-Oct-1941 Sex: female Admission Date (Current Location): 06/06/2022  [facility] and [place] Number:  Chiropodist and Address:  [facility], [address], [place], [place] [address]      Provider Number: [id number]  Attending Physician Name and Address:  Marrion Coy, MD  Relative Name and Phone Number:       Current Level of Care: Hospital Recommended Level of Care: Assisted Living Facility, Memory Care (with PT, OT, RN through [facility]) Prior Approval Number:    Date Approved/Denied:   PASRR Number:    Discharge Plan: Other (Comment) [facility] Care with PT, OT, RN through [facility])    Current Diagnoses: Patient Active Problem List   Diagnosis Date Noted   Pressure injury of skin 06/08/2022   Pneumonia 06/06/2022   Essential hypertension 06/02/2022   AKI (acute kidney injury) (HCC) 06/02/2022   Dementia with behavioral disturbance (HCC) 06/02/2022   Gout 06/02/2022   Multifocal pneumonia 06/02/2022   Acute metabolic encephalopathy 06/02/2022   Hypotension 06/02/2022   Severe sepsis (HCC) 06/01/2022    Orientation RESPIRATION BLADDER Height & Weight     Self  Normal Incontinent Weight: 141 lb 8.6 oz (64.2 kg) Height:  5\' 3"  (160 cm)  BEHAVIORAL SYMPTOMS/MOOD NEUROLOGICAL BOWEL NUTRITION STATUS   (None)  (Dementia) Incontinent Diet (Mechanical soft)  AMBULATORY STATUS COMMUNICATION OF NEEDS Skin   Limited Assist Verbally Bruising, PU Stage and Appropriate Care (Erythema/redness.)   PU Stage 2 Dressing:  (Medical coccyx: Foam.)                   Personal Care Assistance Level of Assistance  Bathing, Feeding, Dressing Bathing Assistance: Limited assistance Feeding assistance: Limited assistance Dressing Assistance: Limited assistance     Functional Limitations Info  Sight,  Hearing, Speech Sight Info: Adequate Hearing Info: Adequate Speech Info: Adequate    SPECIAL CARE FACTORS FREQUENCY  PT (By licensed PT), OT (By licensed OT)     PT Frequency: 3 x week OT Frequency: 3 x week            Contractures Contractures Info: Not present    Additional Factors Info  Code Status, Allergies Code Status Info: Full code Allergies Info: Levofloxacin           Current Medications (06/10/2022):  This is the current hospital active medication list Current Facility-Administered Medications  Medication Dose Route Frequency Provider Last Rate Last Admin   0.9 %  sodium chloride infusion   Intravenous PRN Agbata, Tochukwu, MD 10 mL/hr at 06/07/22 0525 New Bag at 06/07/22 0525   acetaminophen (TYLENOL) tablet 650 mg  650 mg Oral Q6H PRN Agbata, Tochukwu, MD   650 mg at 06/06/22 2137   Or   acetaminophen (TYLENOL) suppository 650 mg  650 mg Rectal Q6H PRN Agbata, Tochukwu, MD       allopurinol (ZYLOPRIM) tablet 100 mg  100 mg Oral Daily Agbata, Tochukwu, MD   100 mg at 06/10/22 0848   Ampicillin-Sulbactam (UNASYN) 3 g in sodium chloride 0.9 % 100 mL IVPB  3 g Intravenous Q6H Nazari, Walid A, RPH 200 mL/hr at 06/10/22 0525 3 g at 06/10/22 0525   enoxaparin (LOVENOX) injection 40 mg  40 mg Subcutaneous Daily Agbata, Tochukwu, MD   40 mg at 06/10/22 0848   LORazepam (ATIVAN) tablet 0.5 mg  0.5 mg Oral  BID PRN Agbata, Tochukwu, MD   0.5 mg at 06/09/22 1820   memantine (NAMENDA) tablet 5 mg  5 mg Oral BID Agbata, Tochukwu, MD   5 mg at 06/10/22 0848   ondansetron (ZOFRAN) tablet 4 mg  4 mg Oral Q6H PRN Agbata, Tochukwu, MD       Or   ondansetron (ZOFRAN) injection 4 mg  4 mg Intravenous Q6H PRN Agbata, Tochukwu, MD       psyllium (HYDROCIL/METAMUCIL) 1 packet  1 packet Oral BID Madueme, Elvira C, RPH   1 packet at 06/10/22 0848   QUEtiapine (SEROQUEL) tablet 25 mg  25 mg Oral QHS Agbata, Tochukwu, MD   25 mg at 06/09/22 2031   senna-docusate (Senokot-S) tablet 1 tablet   1 tablet Oral QHS Agbata, Tochukwu, MD   1 tablet at 06/09/22 2031     Discharge Medications: STOP taking these medications     losartan 50 MG tablet Commonly known as: COZAAR           TAKE these medications     allopurinol 100 MG tablet Commonly known as: ZYLOPRIM Take 100 mg by mouth daily.    amoxicillin-clavulanate 875-125 MG tablet Commonly known as: AUGMENTIN Take 1 tablet by mouth 2 (two) times daily for 3 days.    CALCIUM CARBONATE-VITAMIN D3 PO Take 1 capsule by mouth daily.    LORazepam 0.5 MG tablet Commonly known as: ATIVAN Take 0.5 mg by mouth 2 (two) times daily as needed.    memantine 5 MG tablet Commonly known as: NAMENDA Take 5 mg by mouth 2 (two) times daily.    MULTIVITAMIN/IRON PO Take 1 tablet by mouth daily.    psyllium 28 % packet Commonly known as: METAMUCIL SMOOTH TEXTURE Take 1 packet by mouth 2 (two) times daily.    QUEtiapine 25 MG tablet Commonly known as: SEROQUEL Take 25 mg by mouth at bedtime. What changed: Another medication with the same name was removed. Continue taking this medication, and follow the directions you see here.    senna-docusate 8.6-50 MG tablet Commonly known as: Senokot-S Take 1 tablet by mouth at bedtime.    Zinc 220 (50 Zn) MG Caps Take 1 capsule by mouth daily.    Relevant Imaging Results:  Relevant Lab Results:   Additional Information SS#: [id number]  Margarito Liner, LCSW

## 2022-06-10 NOTE — TOC Transition Note (Signed)
Transition of Care [facility]) - CM/SW Discharge Note   Patient Details  Name: Monique Johnson MRN: [id number] Date of Birth: 1941-06-06  [facility]) CM/SW Contact:  Margarito Liner, LCSW Phone Number: 06/10/2022, 11:48 AM   Clinical Narrative:  Patient has orders to discharge back to [facility]. RN does not need to call report. Sister is here to transport her back to the facility. [facility] representative is aware of discharge. CSW consulted for outpatient palliative. Facility preference is [facility]. Left message for their representative to see if they have outpatient palliative services. If not, will make referral to [facility]. No further concerns. CSW signing off.  Final next level of care: Assisted Living (Memory Care with home health) Barriers to Discharge: Barriers Resolved   Patient Goals and CMS Choice Patient states their goals for this hospitalization and ongoing recovery are:: ALF with [facility] CMS Medicare.gov Compare Post Acute Care list provided to:: Patient Represenative (must comment)    Discharge Placement                Patient to be transferred to facility by: Sister Name of family member notified: [name] Patient and family notified of of transfer: 06/10/22  Discharge Plan and Services                DME Arranged: Community education officer wheelchair with seat cushion, Walker rolling DME Agency: [employer] Date DME Agency Contacted: 06/09/22   Representative spoke with at DME Agency: Bjorn Loser HH Arranged: RN, PT, OT [facility] Agency: [facility] Date [facility] Agency Contacted: 06/10/22   Representative spoke with at [facility] Agency: Becky Sax  Social Determinants of Health (SDOH) Interventions     Readmission Risk Interventions    06/08/2022   11:01 AM  Readmission Risk Prevention Plan  Post Dischage Appt Complete  Medication Screening Complete  Transportation Screening Complete

## 2022-06-10 NOTE — Progress Notes (Signed)
[facility] Liaison Note  Notified by PMT/Alicia Jimmey Ralph of patient/family request of [facility] Paliative services.  [facility] hospital liaison will follow patient for discharge disposition.   Please call with any questions/concerns.    Thank you for the opportunity to participate in this patient's care.   Odette Fraction, MSW [facility] Liaison  [phone]

## 2022-06-10 NOTE — TOC Progression Note (Addendum)
Transition of Care [facility]) - Progression Note    Patient Details  Name: Monique Johnson MRN: [id number] Date of Birth: 03-18-1941  Transition of Care [facility]) CM/SW Contact  Margarito Liner, LCSW Phone Number: 06/10/2022, 9:33 AM  Clinical Narrative:  Left message for Cervante at [facility] to see if they can accept patient back today.   11:31 am: Sister called and asked about alternate placement. Explained that discharge orders were entered yesterday so that is something that would need to be worked on outpatient. Faxed FL2 and discharge summary to facility. Cervante checking to see if they can transport her back. Per RN, wheelchair and RW were delivered to the room.  Expected Discharge Plan: Home w Home Health Services Barriers to Discharge: Continued Medical Work up  Expected Discharge Plan and Services Expected Discharge Plan: Home w Home Health Services       Living arrangements for the past 2 months: Assisted Living Facility Expected Discharge Date: 06/09/22                         HH Arranged: PT, OT, RN HH Agency: [facility] Date [facility] Agency Contacted: 06/08/22   Representative spoke with at [facility] Agency: Elnita Maxwell   Social Determinants of Health (SDOH) Interventions    Readmission Risk Interventions    06/08/2022   11:01 AM  Readmission Risk Prevention Plan  Post Dischage Appt Complete  Medication Screening Complete  Transportation Screening Complete

## 2022-09-12 ENCOUNTER — Other Ambulatory Visit: Payer: Self-pay

## 2022-09-12 ENCOUNTER — Emergency Department: Payer: Medicare Other

## 2022-09-12 ENCOUNTER — Emergency Department
Admission: EM | Admit: 2022-09-12 | Discharge: 2022-09-13 | Disposition: A | Discharge status: Home / Self Care | Payer: Medicare Other | Attending: Emergency Medicine | Admitting: Emergency Medicine

## 2022-09-12 DIAGNOSIS — H1132 Conjunctival hemorrhage, left eye: Secondary | ICD-10-CM | POA: Diagnosis not present

## 2022-09-12 DIAGNOSIS — R519 Headache, unspecified: Secondary | ICD-10-CM | POA: Diagnosis not present

## 2022-09-12 DIAGNOSIS — I10 Essential (primary) hypertension: Secondary | ICD-10-CM | POA: Insufficient documentation

## 2022-09-12 DIAGNOSIS — H5789 Other specified disorders of eye and adnexa: Secondary | ICD-10-CM | POA: Diagnosis present

## 2022-09-12 NOTE — ED Notes (Signed)
PT found walking around B side of ER. PT assisted back to waiting room and placed in triage wait. Pt does not appear to be oriented to anything at this time. Ambulatory with balance.

## 2022-09-12 NOTE — ED Triage Notes (Signed)
Arrived by EMS from [facility]. New onset left eye blood shot and localized swelling this AM around 10am.   Disoriented X4 per baseline- HX alzheimer's and dementia

## 2022-09-12 NOTE — ED Provider Triage Note (Signed)
Emergency Medicine Provider Triage Evaluation Note  Monique Johnson , a 81 y.o. female  was evaluated in triage.  Pt complains of possible subconjunctival hemorrhage.  Patient presents via EMS from urgent care.  Patient is unable to provide any history.  Reportedly with EMS the patient had new onset of what appears to be a subconjunctival hemorrhage without reported trauma.  However staff are unsure whether patient could have fallen.  Review of Systems  Positive: Subconjunctival hemorrhage to the left eye.   Negative: Patient denies any  Physical Exam  BP (!) 181/81 (BP Location: Right Arm)   Pulse 64   Temp 98 F (36.7 C) (Oral)   Resp 16   SpO2 (!) 65%  Gen:   Awake, no distress   Resp:  Normal effort  MSK:   Moves extremities without difficulty  Other:  Subconjunctival hemorrhage to the left eye identified.  Funduscopic exam reveals no other acute abnormality.  No evidence of facial or head trauma  Medical Decision Making  Medically screening exam initiated at 8:25 PM.  Appropriate orders placed.  Monique Johnson was informed that the remainder of the evaluation will be completed by another provider, this initial triage assessment does not replace that evaluation, and the importance of remaining in the ED until their evaluation is complete.  Patient presents from long-term care facility from the memory unit for complaint of subconjunctival hemorrhage.  No reported trauma.  Exam reveals findings consistent with subconjunctival hemorrhage.  Will order CT scan at this time to ensure no traumatic findings   Darletta Moll, PA-C 09/12/22 2030

## 2022-09-12 NOTE — ED Provider Notes (Signed)
[facility] Provider Note    Event Date/Time   First MD Initiated Contact with Patient 09/12/22 2315     (approximate)   History   Eye Problem   HPI  Monique Johnson is a 81 y.o. female who presents to the ED for evaluation of Eye Problem   I reviewed July DC summary where patient was admitted for encephalopathy associated with pneumonia.  Demented at baseline with history of HTN and resides at a local SNF.  Patient presents to the ED for the ration of a red left eye.  No reported trauma.  Physical Exam   Triage Vital Signs: ED Triage Vitals  Enc Vitals Group     BP 09/12/22 2000 (!) 181/81     Pulse Rate 09/12/22 2000 64     Resp 09/12/22 2000 16     Temp 09/12/22 2000 98 F (36.7 C)     Temp Source 09/12/22 2000 Oral     SpO2 09/12/22 2000 (!) 65 %     Weight 09/12/22 2025 150 lb (68 kg)     Height 09/12/22 2025 5\' 3"  (1.6 m)     Head Circumference --      Peak Flow --      Pain Score 09/12/22 2025 0     Pain Loc --      Pain Edu? --      Excl. in [place]? --     Most recent vital signs: Vitals:   09/12/22 2347 09/13/22 0231  BP: (!) 164/57 (!) 171/64  Pulse: 67 66  Resp: 18 17  Temp:    SpO2: 99% 98%    General: Awake, no distress.  CV:  Good peripheral perfusion.  Resp:  Normal effort.  Abd:  No distention.  MSK:  No deformity noted.  Neuro:  No focal deficits appreciated. Other:  Left-sided subconjunctival hemorrhage is noted.  No signs of periorbital hematoma, EOM entrapment.  Round pupil.  She reports normal vision.   ED Results / Procedures / Treatments   Labs (all labs ordered are listed, but only abnormal results are displayed) Labs Reviewed - No data to display  EKG   RADIOLOGY CT head interpreted by me without evidence of acute intracranial pathology CT cervical spine interpreted by me without evidence of fracture or dislocation. CT maxillofacial interpreted by me without evidence of fracture or  dislocation.  Official radiology report(s): CT Maxillofacial Wo Contrast  Result Date: 09/12/2022 CLINICAL DATA:  Possible head trauma localized left eye swelling EXAM: CT MAXILLOFACIAL WITHOUT CONTRAST CT CERVICAL SPINE WITHOUT CONTRAST TECHNIQUE: Multidetector CT imaging of the maxillofacial structures was performed. Multiplanar CT image reconstructions were also generated. A small metallic BB was placed on the right temple in order to reliably differentiate right from left. Multidetector CT imaging of the cervical spine was performed without intravenous contrast. Multiplanar CT image reconstructions were also generated. RADIATION DOSE REDUCTION: This exam was performed according to the departmental dose-optimization program which includes automated exposure control, adjustment of the mA and/or kV according to patient size and/or use of iterative reconstruction technique. COMPARISON:  Chest CT 06/06/2022 FINDINGS: CT MAXILLOFACIAL FINDINGS Osseous: Mastoid air cells are clear. Mandibular heads are normally position. No mandibular fracture. Pterygoid plates and zygomatic arches are intact. No acute nasal bone fracture Orbits: Negative. No traumatic or inflammatory finding. Sinuses: Small osteoma left ethmoid sinus. No acute sinus wall fracture Soft tissues: Negative. Limited intracranial: See separately dictated head CT CT CERVICAL FINDINGS Alignment: Trace retrolisthesis C4  on C5. Facet alignment within normal limits. Skull base and vertebrae: No acute fracture. No primary bone lesion or focal pathologic process. Soft tissues and spinal canal: No prevertebral fluid or swelling. No visible canal hematoma. Disc levels: Moderate disc space narrowing and degenerative change C4-C5, C5-C6 and C6-C7. Multilevel bilateral foraminal narrowing Upper chest: Lung apices are clear.  2.2 cm right thyroid nodule. Other: None IMPRESSION: 1. No acute facial bone fracture identified 2. Degenerative changes of the cervical  spine. No acute osseous abnormality 3. 2.2 cm right thyroid nodule. Recommend thyroid US (ref: J Am Coll Radiol. 2015 Feb;12(2): 143-50).This should be performed on a nonemergent basis. Electronically Signed   By: Jasmine Pang M.D.   On: 09/12/2022 21:45   CT Cervical Spine Wo Contrast  Result Date: 09/12/2022 CLINICAL DATA:  Possible head trauma localized left eye swelling EXAM: CT MAXILLOFACIAL WITHOUT CONTRAST CT CERVICAL SPINE WITHOUT CONTRAST TECHNIQUE: Multidetector CT imaging of the maxillofacial structures was performed. Multiplanar CT image reconstructions were also generated. A small metallic BB was placed on the right temple in order to reliably differentiate right from left. Multidetector CT imaging of the cervical spine was performed without intravenous contrast. Multiplanar CT image reconstructions were also generated. RADIATION DOSE REDUCTION: This exam was performed according to the departmental dose-optimization program which includes automated exposure control, adjustment of the mA and/or kV according to patient size and/or use of iterative reconstruction technique. COMPARISON:  Chest CT 06/06/2022 FINDINGS: CT MAXILLOFACIAL FINDINGS Osseous: Mastoid air cells are clear. Mandibular heads are normally position. No mandibular fracture. Pterygoid plates and zygomatic arches are intact. No acute nasal bone fracture Orbits: Negative. No traumatic or inflammatory finding. Sinuses: Small osteoma left ethmoid sinus. No acute sinus wall fracture Soft tissues: Negative. Limited intracranial: See separately dictated head CT CT CERVICAL FINDINGS Alignment: Trace retrolisthesis C4 on C5. Facet alignment within normal limits. Skull base and vertebrae: No acute fracture. No primary bone lesion or focal pathologic process. Soft tissues and spinal canal: No prevertebral fluid or swelling. No visible canal hematoma. Disc levels: Moderate disc space narrowing and degenerative change C4-C5, C5-C6 and C6-C7.  Multilevel bilateral foraminal narrowing Upper chest: Lung apices are clear.  2.2 cm right thyroid nodule. Other: None IMPRESSION: 1. No acute facial bone fracture identified 2. Degenerative changes of the cervical spine. No acute osseous abnormality 3. 2.2 cm right thyroid nodule. Recommend thyroid US (ref: J Am Coll Radiol. 2015 Feb;12(2): 143-50).This should be performed on a nonemergent basis. Electronically Signed   By: Jasmine Pang M.D.   On: 09/12/2022 21:45   CT Head Wo Contrast  Result Date: 09/12/2022 CLINICAL DATA:  Trauma EXAM: CT HEAD WITHOUT CONTRAST TECHNIQUE: Contiguous axial images were obtained from the base of the skull through the vertex without intravenous contrast. RADIATION DOSE REDUCTION: This exam was performed according to the departmental dose-optimization program which includes automated exposure control, adjustment of the mA and/or kV according to patient size and/or use of iterative reconstruction technique. COMPARISON:  06/06/2022 FINDINGS: Brain: No acute intracranial findings are seen in noncontrast CT brain. There are no signs of bleeding within the cranium. There is no focal mass effect. Cortical sulci are prominent. Vascular: Unremarkable. Skull: Unremarkable. Sinuses/Orbits: Visualized paranasal sinuses are unremarkable. There is decrease in number of air cells in right mastoid with possible minimal right mastoid effusion. Other: None. IMPRESSION: No acute intracranial findings are seen.  Atrophy. Electronically Signed   By: Ernie Avena M.D.   On: 09/12/2022 20:59    PROCEDURES  and INTERVENTIONS:  Procedures  Medications - No data to display   IMPRESSION / MDM / ASSESSMENT AND PLAN / ED COURSE  I reviewed the triage vital signs and the nursing notes.  Differential diagnosis includes, but is not limited to, trauma, cough, emesis, spontaneous hemorrhage  Patient presents to the ED with reportedly atraumatic some conjunctival hemorrhage, suitable for  return to her facility.  Scans of her head and face are reassuring, as above.  No reported trauma and no external evidence of trauma around the eye.  No further work-up indicated.  We will discharge back to her SNF.       FINAL CLINICAL IMPRESSION(S) / ED DIAGNOSES   Final diagnoses:  Subconjunctival hematoma, left     Rx / DC Orders   ED Discharge Orders     None        Note:  This document was prepared using Dragon voice recognition software and may include unintentional dictation errors.   Delton Prairie, MD 09/13/22 [phone]

## 2022-12-24 ENCOUNTER — Other Ambulatory Visit: Payer: Self-pay | Admitting: Obstetrics and Gynecology

## 2022-12-24 ENCOUNTER — Emergency Department
Admission: EM | Admit: 2022-12-24 | Discharge: 2022-12-24 | Disposition: A | Discharge status: Skilled Nursing Facility | Payer: Medicare Other | Attending: Emergency Medicine | Admitting: Emergency Medicine

## 2022-12-24 ENCOUNTER — Other Ambulatory Visit: Payer: Self-pay

## 2022-12-24 ENCOUNTER — Emergency Department: Payer: Medicare Other

## 2022-12-24 DIAGNOSIS — Y92129 Unspecified place in nursing home as the place of occurrence of the external cause: Secondary | ICD-10-CM | POA: Insufficient documentation

## 2022-12-24 DIAGNOSIS — S0101XA Laceration without foreign body of scalp, initial encounter: Secondary | ICD-10-CM | POA: Insufficient documentation

## 2022-12-24 DIAGNOSIS — I10 Essential (primary) hypertension: Secondary | ICD-10-CM | POA: Diagnosis not present

## 2022-12-24 DIAGNOSIS — S0990XA Unspecified injury of head, initial encounter: Secondary | ICD-10-CM | POA: Diagnosis present

## 2022-12-24 DIAGNOSIS — S0003XA Contusion of scalp, initial encounter: Secondary | ICD-10-CM

## 2022-12-24 DIAGNOSIS — Z1231 Encounter for screening mammogram for malignant neoplasm of breast: Secondary | ICD-10-CM

## 2022-12-24 DIAGNOSIS — G309 Alzheimer's disease, unspecified: Secondary | ICD-10-CM | POA: Diagnosis not present

## 2022-12-24 DIAGNOSIS — W19XXXA Unspecified fall, initial encounter: Secondary | ICD-10-CM | POA: Insufficient documentation

## 2022-12-24 NOTE — ED Notes (Signed)
Called [facility] patient on list will pick up as soon as truck is available

## 2022-12-24 NOTE — ED Triage Notes (Signed)
Pt comes via EMs from [facility]. Pt has unwitnessed fall. Pt did hit back of head on hard surface. Pt has dementia unsure if at baseline.  Unknown if on thinners, no loc. Bleeding controlled at this time.

## 2022-12-24 NOTE — Discharge Instructions (Signed)
Clean the area with mild soap and normal saline.  The staples should be removed in 7 to 10 days.  Patient CT of her head and cervical spine were both negative.  Return emergency department worsening

## 2022-12-24 NOTE — ED Notes (Signed)
Secretary notified to set up transport back to [facility].

## 2022-12-24 NOTE — ED Provider Notes (Signed)
[facility] Provider Note    Event Date/Time   First MD Initiated Contact with Patient 12/24/22 1821     (approximate)   History   Fall   HPI  Monique Johnson is a 82 y.o. female with history of Alzheimer's disease and hypertension presents emergency department after an unwitnessed fall in the [place].  She hit the back of her head on a hard surface.  Patient is a very poor historian.  Information was gathered from EMS.      Physical Exam   Triage Vital Signs: ED Triage Vitals  Enc Vitals Group     BP 12/24/22 1737 137/61     Pulse Rate 12/24/22 1737 66     Resp 12/24/22 1737 18     Temp 12/24/22 1737 98 F (36.7 C)     Temp src --      SpO2 12/24/22 1737 98 %     Weight --      Height --      Head Circumference --      Peak Flow --      Pain Score 12/24/22 1736 4     Pain Loc --      Pain Edu? --      Excl. in [place]? --     Most recent vital signs: Vitals:   12/24/22 1737  BP: 137/61  Pulse: 66  Resp: 18  Temp: 98 F (36.7 C)  SpO2: 98%     General: Awake, no distress.   CV:  Good peripheral perfusion. regular rate and  rhythm Resp:  Normal effort.  Abd:  No distention.   Other:  Posterior scalp with swelling and a laceration noted.  No active bleeding at this time.   ED Results / Procedures / Treatments   Labs (all labs ordered are listed, but only abnormal results are displayed) Labs Reviewed - No data to display   EKG     RADIOLOGY CT of the head and cervical spine    PROCEDURES:   .Marland KitchenLaceration Repair  Date/Time: 12/24/2022 6:40 PM  Performed by: Versie Starks, PA-C Authorized by: Versie Starks, PA-C   Consent:    Consent obtained:  Verbal   Consent given by:  Patient   Risks, benefits, and alternatives were discussed: yes     Risks discussed:  Infection, pain, retained foreign body, poor cosmetic result, poor wound healing and need for additional repair   Alternatives discussed:  No  treatment Universal protocol:    Procedure explained and questions answered to patient or proxy's satisfaction: yes     Immediately prior to procedure, a time out was called: yes     Patient identity confirmed:  Verbally with patient and arm band Anesthesia:    Anesthesia method:  None Laceration details:    Location:  Scalp   Scalp location:  Crown   Length (cm):  1 Exploration:    Hemostasis achieved with:  Direct pressure   Imaging outcome: foreign body not noted     Wound exploration: wound explored through full range of motion and entire depth of wound visualized     Wound extent: areolar tissue not violated, fascia not violated, no foreign body, no signs of injury, no nerve damage, no underlying fracture and no vascular damage   Treatment:    Area cleansed with:  Saline   Amount of cleaning:  Standard   Irrigation solution:  Sterile saline   Irrigation method:  Tap Skin repair:  Repair method:  Staples   Number of staples:  1 Approximation:    Approximation:  Close Repair type:    Repair type:  Simple Post-procedure details:    Dressing:  Open (no dressing)   Procedure completion:  Tolerated well, no immediate complications    MEDICATIONS ORDERED IN ED: Medications - No data to display   IMPRESSION / MDM / [place] / ED COURSE  I reviewed the triage vital signs and the nursing notes.                              Differential diagnosis includes, but is not limited to, subdural, subarachnoid, scalp laceration, scalp contusion  Patient's presentation is most consistent with acute complicated illness / injury requiring diagnostic workup.   CT of the head and cervical spine independently reviewed and interpreted by me as having no acute abnormality.  Radiologist does comment on a thyroid nodule that should be followed up with ultrasound  Tried to explain findings to the patient but her Alzheimer's is so severe we will just need to converse with the  nursing home.  She did have a laceration that are repaired with 1 staple.   The staples should be removed in 7 to 10 days.  They can either have her see her doctor, go to urgent care, or return emergency department.  Patient appears to be in stable condition and will be transported back to [facility]   FINAL CLINICAL IMPRESSION(S) / ED DIAGNOSES   Final diagnoses:  Laceration of scalp, initial encounter  Contusion of scalp, initial encounter  Fall, initial encounter     Rx / DC Orders   ED Discharge Orders     None        Note:  This document was prepared using Dragon voice recognition software and may include unintentional dictation errors.    Versie Starks, PA-C 12/24/22 1843    Carrie Mew, MD 12/26/22 [phone]

## 2023-01-19 ENCOUNTER — Ambulatory Visit: Payer: Federal, State, Local not specified - PPO

## 2023-01-26 ENCOUNTER — Ambulatory Visit: Payer: Federal, State, Local not specified - PPO

## 2023-03-06 ENCOUNTER — Ambulatory Visit
Admission: RE | Admit: 2023-03-06 | Discharge: 2023-03-06 | Disposition: A | Discharge status: Home / Self Care | Payer: Medicare Other | Source: Ambulatory Visit | Attending: Obstetrics and Gynecology | Admitting: Obstetrics and Gynecology

## 2023-03-06 DIAGNOSIS — Z1231 Encounter for screening mammogram for malignant neoplasm of breast: Secondary | ICD-10-CM

## 2023-03-10 ENCOUNTER — Other Ambulatory Visit: Payer: Self-pay | Admitting: Obstetrics and Gynecology

## 2023-03-10 DIAGNOSIS — N6489 Other specified disorders of breast: Secondary | ICD-10-CM

## 2023-03-10 DIAGNOSIS — R928 Other abnormal and inconclusive findings on diagnostic imaging of breast: Secondary | ICD-10-CM

## 2023-03-20 ENCOUNTER — Other Ambulatory Visit: Payer: Federal, State, Local not specified - PPO

## 2023-03-25 ENCOUNTER — Ambulatory Visit
Admission: RE | Admit: 2023-03-25 | Discharge: 2023-03-25 | Disposition: A | Discharge status: Home / Self Care | Payer: Medicare Other | Source: Ambulatory Visit | Attending: Obstetrics and Gynecology | Admitting: Obstetrics and Gynecology

## 2023-03-25 DIAGNOSIS — R928 Other abnormal and inconclusive findings on diagnostic imaging of breast: Secondary | ICD-10-CM | POA: Insufficient documentation

## 2023-03-25 DIAGNOSIS — N6489 Other specified disorders of breast: Secondary | ICD-10-CM

## 2023-07-02 DEATH — deceased
